# Patient Record
Sex: Female | Born: 1980
Health system: Southern US, Community
[De-identification: ages and names within clinical notes are randomized; demographics above are authoritative.]

## PROBLEM LIST (undated history)

## (undated) DIAGNOSIS — O24419 Gestational diabetes mellitus in pregnancy, unspecified control: Secondary | ICD-10-CM

## (undated) DIAGNOSIS — R102 Pelvic and perineal pain: Secondary | ICD-10-CM

## (undated) DIAGNOSIS — N854 Malposition of uterus: Secondary | ICD-10-CM

## (undated) DIAGNOSIS — N83202 Unspecified ovarian cyst, left side: Secondary | ICD-10-CM

## (undated) DIAGNOSIS — I10 Essential (primary) hypertension: Secondary | ICD-10-CM

## (undated) DIAGNOSIS — F419 Anxiety disorder, unspecified: Secondary | ICD-10-CM

## (undated) DIAGNOSIS — N926 Irregular menstruation, unspecified: Secondary | ICD-10-CM

## (undated) DIAGNOSIS — G40909 Epilepsy, unspecified, not intractable, without status epilepticus: Secondary | ICD-10-CM

## (undated) HISTORY — DX: Malposition of uterus: N85.4

## (undated) HISTORY — DX: Epilepsy, unspecified, not intractable, without status epilepticus: G40.909

## (undated) HISTORY — DX: Gestational diabetes mellitus in pregnancy, unspecified control: O24.419

## (undated) HISTORY — DX: Essential (primary) hypertension: I10

## (undated) HISTORY — DX: Irregular menstruation, unspecified: N92.6

## (undated) HISTORY — PX: LASIK: SHX215

## (undated) HISTORY — PX: WISDOM TOOTH EXTRACTION: SHX21

## (undated) HISTORY — DX: Unspecified ovarian cyst, left side: N83.202

## (undated) HISTORY — DX: Pelvic and perineal pain: R10.2

---
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
## 2001-02-23 ENCOUNTER — Other Ambulatory Visit: Admission: RE | Admit: 2001-02-23 | Discharge: 2001-02-23 | Payer: Self-pay | Admitting: Obstetrics and Gynecology

## 2002-04-13 ENCOUNTER — Ambulatory Visit (HOSPITAL_COMMUNITY): Admission: RE | Admit: 2002-04-13 | Discharge: 2002-04-13 | Payer: Self-pay | Admitting: Obstetrics and Gynecology

## 2002-04-16 ENCOUNTER — Ambulatory Visit (HOSPITAL_COMMUNITY): Admission: AD | Admit: 2002-04-16 | Discharge: 2002-04-16 | Payer: Self-pay | Admitting: Obstetrics and Gynecology

## 2002-07-04 ENCOUNTER — Observation Stay (HOSPITAL_COMMUNITY): Admission: AD | Admit: 2002-07-04 | Discharge: 2002-07-05 | Payer: Self-pay | Admitting: Obstetrics and Gynecology

## 2002-07-18 ENCOUNTER — Inpatient Hospital Stay (HOSPITAL_COMMUNITY): Admission: AD | Admit: 2002-07-18 | Discharge: 2002-07-21 | Payer: Self-pay | Admitting: Obstetrics & Gynecology

## 2003-01-23 ENCOUNTER — Ambulatory Visit (HOSPITAL_COMMUNITY): Admission: RE | Admit: 2003-01-23 | Discharge: 2003-01-23 | Payer: Self-pay | Admitting: Obstetrics and Gynecology

## 2003-01-23 ENCOUNTER — Encounter: Payer: Self-pay | Admitting: Obstetrics and Gynecology

## 2008-03-16 ENCOUNTER — Other Ambulatory Visit: Admission: RE | Admit: 2008-03-16 | Discharge: 2008-03-16 | Payer: Self-pay | Admitting: Obstetrics & Gynecology

## 2009-06-11 ENCOUNTER — Other Ambulatory Visit: Admission: RE | Admit: 2009-06-11 | Discharge: 2009-06-11 | Payer: Self-pay | Admitting: Obstetrics & Gynecology

## 2010-07-16 ENCOUNTER — Other Ambulatory Visit: Admission: RE | Admit: 2010-07-16 | Discharge: 2010-07-16 | Payer: Self-pay | Admitting: Obstetrics & Gynecology

## 2011-01-16 NOTE — H&P (Signed)
   NAME:  Catherine Bishop, Catherine Bishop                          ACCOUNT NO.:  [id number]   MEDICAL RECORD NO.:  [id number]                   PATIENT TYPE:  INP   LOCATION:  A427                                 FACILITY:  [facility]   PHYSICIAN:  Lazaro Arms, M.D.                DATE OF BIRTH:  Oct 26, 1980   DATE OF ADMISSION:  07/18/2002  DATE OF DISCHARGE:                                HISTORY & PHYSICAL   HISTORY OF PRESENT ILLNESS:  The patient is a 30 year old white female,  gravida 1, para 0, estimated date of delivery 08/02/02, admitted for cervical  ripening and induction.  She has had since the last week in October  significant lower extremity swelling.  Blood pressures have been stable, and  two preeclamptic workups have been negative.  Her swelling continues to be  approximately 2-3+.  Fetal status has been reassuring.  Her cervix is 1,  thick, -3 vertex, and she is admitted for Foley bulb ripening.   PAST MEDICAL HISTORY:  Negative.   PAST SURGICAL HISTORY:  Negative.   PAST OBSTETRICAL HISTORY:  She is nulliparous.   ALLERGIES:  PENICILLIN and SEASONAL ALLERGIES.   MEDICATIONS:  __________ therapy and her vitamins.   SOCIAL HISTORY:  She is married and works in Clinical biochemist.   FAMILY HISTORY:  Significant for diabetes, heart attacks, heart disease.   REVIEW OF SYSTEMS:  Otherwise negative.   PRENATAL LABORATORY DATA:  Her blood type is O positive, antibody screen  negative.  Serology is nonreactive.  Pap, GC negative.  Pap is within normal  limits.  Rubella is immune.  HIV is negative.  __________ is negative.  Glucola was 91.  H&H was good as 12 and 38.  Her AFP was normal.   PHYSICAL EXAMINATION:  HEENT:  Unremarkable.  NECK:  Thyroid is normal.  BREASTS:  Deferred.  ABDOMEN:  Fundal height 37 cm.  PELVIC:  Cervix as above.  EXTREMITIES:  2+ edema.   IMPRESSION:  1. Intrauterine pregnancy at 80 weeks' gestation.  2. Significant lower extremity edema with negative  preeclamptic workups.    PLAN:  The patient is admitted for cervical ripening and induction of labor.  She understands the risks and indications and will proceed.                                                Lazaro Arms, M.D.    Loraine Maple  D:  07/19/2002  T:  07/19/2002  Job:  [id number]

## 2011-01-16 NOTE — Op Note (Signed)
   Catherine Bishop, Catherine Bishop                          ACCOUNT NO.:  [id number]   MEDICAL RECORD NO.:  [id number]                   PATIENT TYPE:  INP   LOCATION:  A427                                 FACILITY:  [facility]   PHYSICIAN:  Lazaro Arms, M.D.                DATE OF BIRTH:  04-Jun-1981   DATE OF PROCEDURE:  07/19/2002  DATE OF DISCHARGE:                                 OPERATIVE REPORT   Catherine Bishop progressed to complete at approximately 16:30. She really was not  feeling any pressure so I allowed the epidural to work until about 17:30 and  had her push a few times but she really did not have any feeling. I cut the  epidural off and we started having feeling of the contractions, we began  pushing. She probably pushed a total of about 20 minutes. She delivered over  a midline episiotomy at 18:42 a viable female infant with Apgar of 9&9  weighing 7 pounds and 14 ounces. Interestingly, the infant had a loose  nuchal cord in a loose figure-of-eight pattern around both ankles, it was  very loose. The cord was clamped, cut and put on the maternal abdomen. The  infant was crying immediately. Cord blood was sent, cord gas could not be  obtained. The episiotomy was midline and was repaired without difficulty.  The placenta was delivered spontaneously. The uterus was somewhat boggy  probably from the long labor and I gave her methargen 0.2 IM and will  continue methargen series postpartum as well as the Pitocin. There was  probably 300 cc blood loss for the delivery. The patient will undergo  routine postpartum care and the infant will undergo routine neonatal care.  She is Rh positive and group B strep negative and she was breast feeding.                                               Lazaro Arms, M.D.    Loraine Maple  D:  07/19/2002  T:  07/19/2002  Job:  [id number]

## 2011-01-16 NOTE — Op Note (Signed)
   NAME:  Catherine Bishop, Catherine Bishop                          ACCOUNT NO.:  [id number]   MEDICAL RECORD NO.:  [id number]                   PATIENT TYPE:  INP   LOCATION:  A427                                 FACILITY:  [facility]   PHYSICIAN:  Lazaro Arms, M.D.                DATE OF BIRTH:  11-16-1980   DATE OF PROCEDURE:  07/16/2002  DATE OF DISCHARGE:                                 OPERATIVE REPORT   PROCEDURE:  Epidural.   INDICATIONS FOR PROCEDURE:  Catherine Bishop is a 30 year old, gravida 1, para 0 term  at 5 cm dilated who was getting really no relief from IV pain medicine. She  had been counselled extensively regarding epidural and was called and  starting placing it about noon. The patient was given a 1000 cc LR bolus.  The patient was placed in sitting position, iliac crests were identified.  The spinous processes were identified. The L2-3 and L3-4 interspaces were  identified and marked. The back was prepped and draped, 1% lidocaine was  injected as a skin wheal. A 17 gauge Tuohy needle was used and the epidural  space was found with a single puncture using a loss of resistance technique.  The catheter was fed to a depth of 5 cm into the epidural space. The 10 cc  of 0.125% bupivacaine  plain was given as a test dose with no reaction, 10  cc of 0.125% bupivacaine was given again and again with no problem. She was  then hooked up to continuous pump. She was given a 9 cc bolus and 12 cc  continuous an hour. She had some blood pressure drop about an hour after  insertion that was remedied with ephedrine. The fetal heart rate remained  reassuring. The patient received good relief to T8.                                               Lazaro Arms, M.D.    Loraine Maple  D:  07/19/2002  T:  07/19/2002  Job:  [id number]

## 2011-01-16 NOTE — Discharge Summary (Signed)
Catherine Bishop, Catherine Bishop                          ACCOUNT NO.:  [id number]   MEDICAL RECORD NO.:  [id number]                   PATIENT TYPE:  OBV   LOCATION:  A415                                 FACILITY:  [facility]   PHYSICIAN:  Roylene Reason. Lisette Grinder, M.D.             DATE OF BIRTH:  27-Sep-1980   DATE OF ADMISSION:  04/16/2002  DATE OF DISCHARGE:  04/16/2002                                 DISCHARGE SUMMARY   HISTORY OF PRESENT ILLNESS:  This is a 30 year old gravida 1, para 0 at [redacted]  weeks gestation who comes into the hospital complaining of cramping and  brown vaginal discharge.  The patient states she had low pelvic cramping  suprapubically last p.m.  She contacted me at that time at which time I  advised her to present to labor and delivery.  However, patient states that  she laid down, went to sleep, and awoke this a.m. again with the suprapubic  cramping.  She has had some brown discharge as well as some vaginal mucus  with some blood streaking.  She denies any bright red vaginal bleeding.  She  denies any significant uterine cramping other than suprapubically.  She  denies any back pain.  No urinary tract symptoms.  No leakage of fluid.  No  change in the vaginal discharge.  No external burning, itching, or odor.  The patient's prenatal course was complicated by a visit to labor and  delivery three days previously with similar complaints.  At that time she  was examined and noted to have closed cervix.  Follow-up the following day  in our office ultrasound was performed, presumably just to check for follow-  up on an adnexal cyst which had apparently resolved.  The patient's prenatal  course is otherwise uncomplicated.   PHYSICAL EXAMINATION:  GENERAL:  She is in no acute distress.  VITAL SIGNS:  Temperature 98.8, pulse 94, respiratory rate 18, blood  pressure 126/68.  HEENT:  Negative.  Mucous membranes are moist.  ABDOMEN:  Soft, nontender.  Uterus is soft, nontender.  Fundal height  is  consistent with [redacted] weeks gestation.  Cannot confirm presenting part.  PELVIC:  Sterile speculum examination performed reveals normal external  genitalia.  No lesions or ulcerations identified.  No vaginal bleeding or  leakage of fluid.  Cervix is visualized, noted to be without any lesions.  Cervix is visually closed.  Normal appearing leukorrhea present with no  bleeding or brown discharge identified.  External fetal monitor reveals  fetal heart rate in the 150s with no decelerations noted.  Normal appearing  long-term variability for [redacted] weeks gestation.  Limited OB ultrasound  performed by Dr. Roylene Reason. Lisette Grinder reveals single intrauterine pregnancy,  fetal cardiac activity identified, fetal movement identified, vertex  presentation at this time.  There is some anterior low lying placenta, but  no placenta previa.  Amniotic fluid is subjectively normal.  BPD and femur  length are consistent with 23-[redacted] weeks gestation.  Anatomic survey is not  performed.  Fetus appears to be female.   LABORATORY STUDIES:  1+ wbc's, 1+ protein, only trace blood.   ASSESSMENT:  A 24 week intrauterine pregnancy with very minimal second  trimester spotting primarily with wiping and limited in mucus.  No evidence  of any vaginitis.  No evidence of any significant vaginal bleeding.  Cramping patient has experienced is suprapubic only in nature and is not  uterine.  Thus, patient discharged to home at this time to follow up if  clinically indicated, particularly early if vaginal bleeding would increase  or if the cramping would become more suggestive of preterm labor.    FINAL DIAGNOSES:  1. Second trimester vaginal spotting.  2. Anterior low lying placenta without placenta previa.   PROCEDURE:  Limited obstetric ultrasound.                                               Donald P. Lisette Grinder, M.D.    DPC/MEDQ  D:  04/16/2002  T:  04/17/2002  Job:  [id number]   cc:   [facility]

## 2011-07-21 ENCOUNTER — Other Ambulatory Visit: Payer: Self-pay | Admitting: Obstetrics & Gynecology

## 2011-07-21 ENCOUNTER — Other Ambulatory Visit (HOSPITAL_COMMUNITY)
Admission: RE | Admit: 2011-07-21 | Discharge: 2011-07-21 | Disposition: A | Discharge status: Home / Self Care | Payer: 59 | Source: Ambulatory Visit | Attending: Obstetrics & Gynecology | Admitting: Obstetrics & Gynecology

## 2011-07-21 DIAGNOSIS — Z01419 Encounter for gynecological examination (general) (routine) without abnormal findings: Secondary | ICD-10-CM | POA: Insufficient documentation

## 2011-09-01 NOTE — L&D Delivery Note (Signed)
Delivery Note At 3:51 AM a viable female was delivered via (Presentation: LOA ) w/ compound posterior (left) hand, nuchal cord, and body cord- somersaulted through.  APGAR: pending,  Weight: pending   Placenta status: 3vc, delivered spontaneously, intact, circumvallate appearance.  Cord:  3vc Dr. Marice Potter called to evaluate laceration, 2 whitish-yellow areas noted w/in laceration  Anesthesia:  Epidural, 1% lidocaine for repair Episiotomy: n/a Lacerations: 2nd degree perineal lac repaired by dr. Marice Potter, 2 inclusion cyts Suture Repair: 3.0 vicryl Est. Blood Loss (mL): 350  Mom to postpartum.  Baby to nursery-stable.  Plans to breastfeed, undecided about contraception.  [facility].  Marge Duncans 07/10/2012, 4:25 AM

## 2011-11-12 LAB — OB RESULTS CONSOLE HIV ANTIBODY (ROUTINE TESTING): HIV: NONREACTIVE

## 2011-11-12 LAB — OB RESULTS CONSOLE RPR: RPR: NONREACTIVE

## 2011-11-12 LAB — OB RESULTS CONSOLE ANTIBODY SCREEN: Antibody Screen: NEGATIVE

## 2011-11-12 LAB — OB RESULTS CONSOLE RUBELLA ANTIBODY, IGM: Rubella: IMMUNE

## 2012-05-18 ENCOUNTER — Encounter: Payer: Self-pay | Admitting: *Deleted

## 2012-05-18 ENCOUNTER — Encounter: Payer: 59 | Attending: Obstetrics & Gynecology | Admitting: *Deleted

## 2012-05-18 DIAGNOSIS — O99810 Abnormal glucose complicating pregnancy: Secondary | ICD-10-CM | POA: Insufficient documentation

## 2012-05-18 DIAGNOSIS — Z713 Dietary counseling and surveillance: Secondary | ICD-10-CM | POA: Insufficient documentation

## 2012-05-18 NOTE — Progress Notes (Signed)
  Patient was seen on 05/18/2012 for Gestational Diabetes self-management class at the [facility]. The following learning objectives were met by the patient during this course:   States the definition of Gestational Diabetes  States why dietary management is important in controlling blood glucose  Describes the effects each nutrient has on blood glucose levels  Demonstrates ability to create a balanced meal plan  Demonstrates carbohydrate counting   States when to check blood glucose levels  Demonstrates proper blood glucose monitoring techniques  States the effect of stress and exercise on blood glucose levels  States the importance of limiting caffeine and abstaining from alcohol and smoking  Blood glucose monitor given: Accu Chek Nano BG Monitoring Kit Lot # [id number] Exp: 10/2015 Blood glucose reading: 99 mg/dl  Patient instructed to monitor glucose levels: FBS: 60 - <90 2 hour: <120  *Patient received handouts:  Nutrition Diabetes and Pregnancy  Carbohydrate Counting List  Patient will be seen for follow-up as needed.

## 2012-05-18 NOTE — Patient Instructions (Signed)
Goals:  Check glucose levels per MD as instructed  Follow Gestational Diabetes Diet as instructed  Call for follow-up as needed    

## 2012-06-23 LAB — OB RESULTS CONSOLE GC/CHLAMYDIA: Chlamydia: NEGATIVE

## 2012-06-23 LAB — OB RESULTS CONSOLE GBS: GBS: NEGATIVE

## 2012-06-28 ENCOUNTER — Encounter (HOSPITAL_COMMUNITY): Payer: Self-pay | Admitting: *Deleted

## 2012-06-28 ENCOUNTER — Telehealth (HOSPITAL_COMMUNITY): Payer: Self-pay | Admitting: *Deleted

## 2012-06-28 NOTE — Telephone Encounter (Signed)
Preadmission screen  

## 2012-07-09 ENCOUNTER — Encounter (HOSPITAL_COMMUNITY): Payer: Self-pay

## 2012-07-09 ENCOUNTER — Inpatient Hospital Stay (HOSPITAL_COMMUNITY)
Admission: RE | Admit: 2012-07-09 | Discharge: 2012-07-11 | DRG: 775 | Disposition: A | Discharge status: Home / Self Care | Payer: 59 | Source: Ambulatory Visit | Attending: Obstetrics and Gynecology | Admitting: Obstetrics and Gynecology

## 2012-07-09 DIAGNOSIS — O99814 Abnormal glucose complicating childbirth: Principal | ICD-10-CM | POA: Diagnosis present

## 2012-07-09 DIAGNOSIS — O328XX0 Maternal care for other malpresentation of fetus, not applicable or unspecified: Secondary | ICD-10-CM | POA: Diagnosis present

## 2012-07-09 HISTORY — DX: Anxiety disorder, unspecified: F41.9

## 2012-07-09 LAB — CBC
HCT: 37.5 % (ref 36.0–46.0)
MCHC: 33.6 g/dL (ref 30.0–36.0)
MCV: 85 fL (ref 78.0–100.0)
RDW: 14.9 % (ref 11.5–15.5)

## 2012-07-09 LAB — TYPE AND SCREEN: ABO/RH(D): O POS

## 2012-07-09 LAB — ABO/RH: ABO/RH(D): O POS

## 2012-07-09 MED ORDER — LACTATED RINGERS IV SOLN
INTRAVENOUS | Status: DC
  Administered 2012-07-09 (×2): via INTRAVENOUS

## 2012-07-09 MED ORDER — TERBUTALINE SULFATE 1 MG/ML IJ SOLN: 0.2500 mg | Freq: Once | INTRAMUSCULAR | Status: AC | PRN

## 2012-07-09 MED ORDER — ACETAMINOPHEN 325 MG PO TABS: 650.0000 mg | ORAL_TABLET | ORAL | Status: DC | PRN

## 2012-07-09 MED ORDER — LIDOCAINE HCL (PF) 1 % IJ SOLN
30.0000 mL | INTRAMUSCULAR | Status: DC | PRN
  Administered 2012-07-10: 30 mL via SUBCUTANEOUS
  Filled 2012-07-09: qty 30

## 2012-07-09 MED ORDER — CITRIC ACID-SODIUM CITRATE 334-500 MG/5ML PO SOLN
30.0000 mL | ORAL | Status: DC | PRN
  Administered 2012-07-10: 30 mL via ORAL
  Filled 2012-07-09: qty 15

## 2012-07-09 MED ORDER — MISOPROSTOL 25 MCG QUARTER TABLET
25.0000 ug | ORAL_TABLET | ORAL | Status: DC | PRN
  Administered 2012-07-09 (×2): 25 ug via VAGINAL
  Filled 2012-07-09 (×4): qty 0.25

## 2012-07-09 MED ORDER — ONDANSETRON HCL 4 MG/2ML IJ SOLN: 4.0000 mg | Freq: Four times a day (QID) | INTRAMUSCULAR | Status: DC | PRN

## 2012-07-09 MED ORDER — OXYTOCIN 40 UNITS IN LACTATED RINGERS INFUSION - SIMPLE MED
62.5000 mL/h | INTRAVENOUS | Status: DC
  Filled 2012-07-09: qty 1000

## 2012-07-09 MED ORDER — IBUPROFEN 600 MG PO TABS
600.0000 mg | ORAL_TABLET | Freq: Four times a day (QID) | ORAL | Status: DC | PRN
  Filled 2012-07-09: qty 1

## 2012-07-09 MED ORDER — OXYTOCIN BOLUS FROM INFUSION
500.0000 mL | INTRAVENOUS | Status: DC
  Administered 2012-07-10: 500 mL via INTRAVENOUS

## 2012-07-09 MED ORDER — OXYCODONE-ACETAMINOPHEN 5-325 MG PO TABS: 1.0000 | ORAL_TABLET | ORAL | Status: DC | PRN

## 2012-07-09 MED ORDER — LACTATED RINGERS IV SOLN: 500.0000 mL | INTRAVENOUS | Status: DC | PRN

## 2012-07-09 NOTE — Progress Notes (Signed)
Pt would like to wait until K. Booker, CNM comes on and talks about POC before making a decision about what steps to take next.

## 2012-07-09 NOTE — Progress Notes (Signed)
Patient ID: IDALIA CLASEN, female   DOB: 1980/10/13, 31 y.o.   MRN: [id number] KIRAN LUPOLI is a 31 y.o. G2P1001 at [redacted]w[redacted]d admitted for IOL  Subjective: Comfortable.  Objective: BP 131/74  Pulse 91  Temp 98.2 F (36.8 C) (Oral)  Resp 18  Ht 5\' 6"  (1.676 m)  Wt 112.492 kg (248 lb)  BMI 40.03 kg/m2  LMP 10/14/2011 Isolated BP elevation   Fetal Heart FHR: 125-135 bpm, variability: moderate,  accelerations:  Present,  decelerations:  Present occ mild variable   Contractions: irreg, q 7-8  SVE:   Dilation: Fingertip Effacement (%): 50 Station: -2 Exam by:: k fields, rn Second cytotec placed by RN  Assessment / Plan:  Labor: cx ripening in progress Fetal Wellbeing: Cat 1 Pain Control:  N/A Expected mode of delivery: NSVD  Rondrick Barreira 07/09/2012, 2:51 PM

## 2012-07-09 NOTE — Progress Notes (Signed)
Patient ID: Catherine Bishop, female   DOB: [birth year], 31 y.o.   MRN: [id number]  Up walking  FHR has been reassuring UCs irregular  Will try to insert Foley at next exam.

## 2012-07-09 NOTE — Progress Notes (Signed)
Patient ID: Catherine Bishop, female   DOB: 06-28-1981, 31 y.o.   MRN: [id number]  Patient had SROM at 1936 of clear fluid.  Cytotec being held due to frequent UCs.   Desires to walk off and on  FHR stable and reassuring.  UCs every 3-5 minutes  Will reevaluate in an hour or so.

## 2012-07-09 NOTE — Progress Notes (Signed)
Patient ID: Catherine Bishop, female   DOB: [birth year], 31 y.o.   MRN: [id number]  Up walking  Last tracing showed reassuring FHR with one single variable decel  UCs spaced somewhat to 4 per 10 minutes  RN will check her when she comes back and we will reevaluate next steps

## 2012-07-09 NOTE — Progress Notes (Signed)
Patient ID: Catherine Bishop, female   DOB: March 15, 1981, 31 y.o.   MRN: [id number]  Attempted insertion of foley bulb  Cervix 1+cm outer os, closed internal os 60% -1 Vertex  FHR reactive UCs frequent, every 2-3 minutes  Will defer Cytotec for now and reevaluate later

## 2012-07-09 NOTE — H&P (Signed)
Catherine Bishop is a 31 y.o. female presenting for IOL. Maternal Medical History:  Reason for admission: G2P1001 at 39.4 wks for IOL indicated by A1GDM  Contractions: Onset was more than 2 days ago.   Frequency: irregular.   Perceived severity is mild.    Fetal activity: Perceived fetal activity is normal.   Last perceived fetal movement was within the past hour.    Prenatal complications: FATs biwekly d/t low PAPPA; A1 GDM  Prenatal Complications - Diabetes: gestational. Diabetes is managed by diet.      OB History    Grav Para Term Preterm Abortions TAB SAB Ect Mult Living   2 1 1       1      Past Medical History  Diagnosis Date  . Pelvic pain in female   . Left ovarian cyst   . Irregular bleeding   . Tilted uterus   . GDM (gestational diabetes mellitus)   . Hypertension   . Epilepsy     childhood  . Gestational diabetes     diet controlled  . Anxiety    Past Surgical History  Procedure Date  . Wisdom tooth extraction    Family History: family history includes Cancer in her maternal grandmother; Diabetes in her maternal grandmother; Heart attack in her maternal grandfather; and Heart disease in her maternal grandfather. Social History:  reports that she quit smoking about 4 years ago. Her smoking use included Cigarettes. She smoked .5 packs per day. She has never used smokeless tobacco. She reports that she does not drink alcohol or use illicit drugs.   Prenatal Transfer Tool  Maternal Diabetes: Yes:  Diabetes Type:  Diet controlled Genetic Screening: Abnormal:  Results: Elevated risk of Trisomy 21 Maternal Ultrasounds/Referrals: Normal Fetal Ultrasounds or other Referrals:  None Maternal Substance Abuse:  No Significant Maternal Medications:  None Significant Maternal Lab Results:  Lab values include: Group B Strep negative, Other: low PAPPA Other Comments:  None  Review of Systems  Constitutional: Negative for fever.  Cardiovascular: Positive for leg  swelling.  Gastrointestinal: Negative for abdominal pain.  Genitourinary: Negative for dysuria.  Skin: Negative for rash.  Neurological: Negative for dizziness and headaches.  Psychiatric/Behavioral: Negative for depression.      Blood pressure 134/84, pulse 103, temperature 98 F (36.7 C), temperature source Oral, resp. rate 24, height 5\' 6"  (1.676 m), weight 112.492 kg (248 lb), last menstrual period 10/14/2011. Maternal Exam:  Uterine Assessment: Contraction strength is mild.  Contraction duration is 20 seconds. Contraction frequency is irregular.   Abdomen: Estimated fetal weight is 7 1/2 - 8#.   Fetal presentation: vertex  Introitus: Normal vulva. Normal vagina.  Vagina is negative for discharge.  Ferning test: not done.  Nitrazine test: not done. Amniotic fluid character: not assessed.  Pelvis: adequate for delivery.   Cervix: Cervix evaluated by digital exam.   Posterior, soft, FTP, long, -3 cehpalic  Fetal Exam Fetal Monitor Review: Mode: ultrasound.   Baseline rate: 140.  Variability: moderate (6-25 bpm).   Pattern: accelerations present and no decelerations.    Fetal State Assessment: Category I - tracings are normal.     Physical Exam  Constitutional: She is oriented to person, place, and time. She appears well-developed and well-nourished.  HENT:  Head: Normocephalic.  Eyes: Pupils are equal, round, and reactive to light.  Neck: Normal range of motion.  Cardiovascular: Normal rate and normal heart sounds.   No murmur heard. Respiratory: Effort normal and breath sounds normal.  GI: Soft. There is no tenderness.  Genitourinary: No vaginal discharge found.  Musculoskeletal: Normal range of motion. She exhibits edema.       1-2+ LE edema, no clonus  Neurological: She is alert and oriented to person, place, and time. She has normal reflexes.  Skin: Skin is warm and dry.  Psychiatric: She has a normal mood and affect. Her behavior is normal. Judgment normal.      Prenatal labs: ABO, Rh: O/Positive/-- (03/14 0000) Antibody: Negative (03/14 0000) Rubella: Immune (03/14 0000) RPR: Nonreactive (03/14 0000)  HBsAg: Negative (03/14 0000)  HIV: Non-reactive (03/14 0000)  GBS: Negative (10/24 0000)  2 hr: 83-153-156 Low PAPPA  Assessment/Plan: G2P1001 at 39.4 wks admitted for cx ripening/IOL indicated by A1GDM and low PAPPA Cytotec for cx ripeining   Catherine Bishop 07/09/2012, 9:24 AM

## 2012-07-10 ENCOUNTER — Encounter (HOSPITAL_COMMUNITY): Payer: Self-pay | Admitting: Anesthesiology

## 2012-07-10 ENCOUNTER — Encounter (HOSPITAL_COMMUNITY): Payer: Self-pay

## 2012-07-10 ENCOUNTER — Inpatient Hospital Stay (HOSPITAL_COMMUNITY): Payer: 59 | Admitting: Anesthesiology

## 2012-07-10 LAB — CBC
HCT: 35.4 % — ABNORMAL LOW (ref 36.0–46.0)
HCT: 34.3 % — ABNORMAL LOW (ref 36.0–46.0)
Hemoglobin: 11.6 g/dL — ABNORMAL LOW (ref 12.0–15.0)
Hemoglobin: 11.4 g/dL — ABNORMAL LOW (ref 12.0–15.0)
MCH: 28.4 pg (ref 26.0–34.0)
MCV: 85.5 fL (ref 78.0–100.0)
Platelets: 269 10*3/uL (ref 150–400)
RBC: 4.01 MIL/uL (ref 3.87–5.11)
RDW: 15.2 % (ref 11.5–15.5)
WBC: 13.9 10*3/uL — ABNORMAL HIGH (ref 4.0–10.5)
WBC: 15.2 10*3/uL — ABNORMAL HIGH (ref 4.0–10.5)

## 2012-07-10 MED ORDER — SODIUM CHLORIDE 0.9 % IJ SOLN: 3.0000 mL | INTRAMUSCULAR | Status: DC | PRN

## 2012-07-10 MED ORDER — SIMETHICONE 80 MG PO CHEW: 80.0000 mg | CHEWABLE_TABLET | ORAL | Status: DC | PRN

## 2012-07-10 MED ORDER — MEASLES, MUMPS & RUBELLA VAC ~~LOC~~ INJ
0.5000 mL | INJECTION | Freq: Once | SUBCUTANEOUS | Status: DC
  Filled 2012-07-10: qty 0.5

## 2012-07-10 MED ORDER — BENZOCAINE-MENTHOL 20-0.5 % EX AERO
1.0000 "application " | INHALATION_SPRAY | CUTANEOUS | Status: DC | PRN
  Administered 2012-07-10 – 2012-07-11 (×2): 1 via TOPICAL
  Filled 2012-07-10 (×3): qty 56

## 2012-07-10 MED ORDER — IBUPROFEN 600 MG PO TABS
600.0000 mg | ORAL_TABLET | Freq: Four times a day (QID) | ORAL | Status: DC
  Administered 2012-07-10 – 2012-07-11 (×6): 600 mg via ORAL
  Filled 2012-07-10 (×5): qty 1

## 2012-07-10 MED ORDER — ONDANSETRON HCL 4 MG/2ML IJ SOLN: 4.0000 mg | INTRAMUSCULAR | Status: DC | PRN

## 2012-07-10 MED ORDER — BISACODYL 10 MG RE SUPP
10.0000 mg | Freq: Every day | RECTAL | Status: DC | PRN
  Filled 2012-07-10: qty 1

## 2012-07-10 MED ORDER — DIPHENHYDRAMINE HCL 50 MG/ML IJ SOLN: 12.5000 mg | INTRAMUSCULAR | Status: DC | PRN

## 2012-07-10 MED ORDER — FENTANYL 2.5 MCG/ML BUPIVACAINE 1/10 % EPIDURAL INFUSION (WH - ANES)
14.0000 mL/h | INTRAMUSCULAR | Status: DC
  Administered 2012-07-10: 14 mL/h via EPIDURAL
  Filled 2012-07-10: qty 125

## 2012-07-10 MED ORDER — DIBUCAINE 1 % RE OINT
1.0000 "application " | TOPICAL_OINTMENT | RECTAL | Status: DC | PRN
  Administered 2012-07-10: 1 via RECTAL
  Filled 2012-07-10 (×2): qty 28

## 2012-07-10 MED ORDER — LIDOCAINE HCL (PF) 1 % IJ SOLN
INTRAMUSCULAR | Status: DC | PRN
  Administered 2012-07-10 (×2): 5 mL

## 2012-07-10 MED ORDER — ZOLPIDEM TARTRATE 5 MG PO TABS: 5.0000 mg | ORAL_TABLET | Freq: Every evening | ORAL | Status: DC | PRN

## 2012-07-10 MED ORDER — PHENYLEPHRINE 40 MCG/ML (10ML) SYRINGE FOR IV PUSH (FOR BLOOD PRESSURE SUPPORT): 80.0000 ug | PREFILLED_SYRINGE | INTRAVENOUS | Status: DC | PRN

## 2012-07-10 MED ORDER — WITCH HAZEL-GLYCERIN EX PADS
1.0000 "application " | MEDICATED_PAD | CUTANEOUS | Status: DC | PRN
  Administered 2012-07-10: 1 via TOPICAL

## 2012-07-10 MED ORDER — PRENATAL MULTIVITAMIN CH
1.0000 | ORAL_TABLET | Freq: Every day | ORAL | Status: DC
  Administered 2012-07-10 – 2012-07-11 (×2): 1 via ORAL
  Filled 2012-07-10 (×2): qty 1

## 2012-07-10 MED ORDER — OXYCODONE-ACETAMINOPHEN 5-325 MG PO TABS
1.0000 | ORAL_TABLET | ORAL | Status: DC | PRN
  Filled 2012-07-10: qty 1

## 2012-07-10 MED ORDER — PHENYLEPHRINE 40 MCG/ML (10ML) SYRINGE FOR IV PUSH (FOR BLOOD PRESSURE SUPPORT)
80.0000 ug | PREFILLED_SYRINGE | INTRAVENOUS | Status: DC | PRN
  Filled 2012-07-10: qty 5

## 2012-07-10 MED ORDER — LANOLIN HYDROUS EX OINT: TOPICAL_OINTMENT | CUTANEOUS | Status: DC | PRN

## 2012-07-10 MED ORDER — LACTATED RINGERS IV SOLN
500.0000 mL | Freq: Once | INTRAVENOUS | Status: AC
  Administered 2012-07-10: 500 mL via INTRAVENOUS

## 2012-07-10 MED ORDER — SODIUM CHLORIDE 0.9 % IV SOLN: 250.0000 mL | INTRAVENOUS | Status: DC | PRN

## 2012-07-10 MED ORDER — SENNOSIDES-DOCUSATE SODIUM 8.6-50 MG PO TABS
2.0000 | ORAL_TABLET | Freq: Every day | ORAL | Status: DC
  Administered 2012-07-10: 2 via ORAL

## 2012-07-10 MED ORDER — EPHEDRINE 5 MG/ML INJ: 10.0000 mg | INTRAVENOUS | Status: DC | PRN

## 2012-07-10 MED ORDER — ONDANSETRON HCL 4 MG PO TABS: 4.0000 mg | ORAL_TABLET | ORAL | Status: DC | PRN

## 2012-07-10 MED ORDER — OXYTOCIN 40 UNITS IN LACTATED RINGERS INFUSION - SIMPLE MED: 62.5000 mL/h | INTRAVENOUS | Status: DC | PRN

## 2012-07-10 MED ORDER — SODIUM CHLORIDE 0.9 % IJ SOLN: 3.0000 mL | Freq: Two times a day (BID) | INTRAMUSCULAR | Status: DC

## 2012-07-10 MED ORDER — TETANUS-DIPHTH-ACELL PERTUSSIS 5-2.5-18.5 LF-MCG/0.5 IM SUSP
0.5000 mL | Freq: Once | INTRAMUSCULAR | Status: AC
  Administered 2012-07-11: 0.5 mL via INTRAMUSCULAR
  Filled 2012-07-10: qty 0.5

## 2012-07-10 MED ORDER — FLEET ENEMA 7-19 GM/118ML RE ENEM: 1.0000 | ENEMA | Freq: Every day | RECTAL | Status: DC | PRN

## 2012-07-10 MED ORDER — DIPHENHYDRAMINE HCL 25 MG PO CAPS: 25.0000 mg | ORAL_CAPSULE | Freq: Four times a day (QID) | ORAL | Status: DC | PRN

## 2012-07-10 NOTE — Anesthesia Procedure Notes (Signed)
Epidural Patient location during procedure: OB Start time: 07/10/2012 2:02 AM  Staffing Anesthesiologist: Brayton Caves R Performed by: anesthesiologist   Preanesthetic Checklist Completed: patient identified, site marked, surgical consent, pre-op evaluation, timeout performed, IV checked, risks and benefits discussed and monitors and equipment checked  Epidural Patient position: sitting Prep: site prepped and draped and DuraPrep Patient monitoring: continuous pulse ox and blood pressure Approach: midline Injection technique: LOR air and LOR saline  Needle:  Needle type: Tuohy  Needle gauge: 17 G Needle length: 9 cm and 9 Needle insertion depth: 8 cm Catheter type: closed end flexible Catheter size: 19 Gauge Catheter at skin depth: 14 cm Test dose: negative  Assessment Events: blood not aspirated, injection not painful, no injection resistance, negative IV test and no paresthesia  Additional Notes Patient identified.  Risk benefits discussed including failed block, incomplete pain control, headache, nerve damage, paralysis, blood pressure changes, nausea, vomiting, reactions to medication both toxic or allergic, and postpartum back pain.  Patient expressed understanding and wished to proceed.  All questions were answered.  Sterile technique used throughout procedure and epidural site dressed with sterile barrier dressing. No paresthesia or other complications noted.The patient did not experience any signs of intravascular injection such as tinnitus or metallic taste in mouth nor signs of intrathecal spread such as rapid motor block. Please see nursing notes for vital signs.

## 2012-07-10 NOTE — Progress Notes (Signed)
Catherine Bishop is a 31 y.o. G2P1001 at [redacted]w[redacted]d admitted for induction of labor due to A1DM and low PAPP-A.  Subjective: Mostly comfortable w/ epidural, having sharp pain and pressure in vaginal area w/ uc's. Family supportive at bs.   Objective: BP 118/78  Pulse 88  Temp 98.5 F (36.9 C) (Oral)  Resp 20  Ht 5\' 6"  (1.676 m)  Wt 112.492 kg (248 lb)  BMI 40.03 kg/m2  SpO2 100%  LMP 10/14/2011      FHT:  FHR: 150 bpm, variability: moderate,  accelerations:  Abscent,  decelerations:  Present earlies and variables UC:   irregular, every 1-4 minutes SVE:   6.5/90/-2, posterior  Labs: Lab Results  Component Value Date   WBC 13.9* 07/10/2012   HGB 11.6* 07/10/2012   HCT 35.4* 07/10/2012   MCV 86.1 07/10/2012   PLT 295 07/10/2012    Assessment / Plan: IOL d/t A1DM and low PAPP-A, progressing normally, RN to position pt in exaggerated sims and hit epidural pcea button  Labor: Progressing normally Preeclampsia:  n/a Fetal Wellbeing:  Category II Pain Control:  Epidural I/D:  n/a Anticipated MOD:  NSVD  Catherine Bishop 07/10/2012, 2:51 AM

## 2012-07-10 NOTE — Progress Notes (Signed)
Catherine Bishop is a 31 y.o. G2P1001 at [redacted]w[redacted]d admitted for induction of labor due to Gestational diabetes and low PAPP-A.  Subjective: Very uncomfortable w/ uc's, anxious, breathing well.  FOB and family supportive at bs. Requests epidural  Objective: BP 136/103  Pulse 87  Temp 98 F (36.7 C) (Oral)  Resp 20  Ht 5\' 6"  (1.676 m)  Wt 112.492 kg (248 lb)  BMI 40.03 kg/m2  LMP 10/14/2011      FHT:  FHR: 130 bpm, variability: moderate,  accelerations:  Present,  decelerations:  Absent UC:   regular, every 2-3 minutes SVE:   Dilation: 6 Effacement (%): 70 Station: -2 Exam by:: K Martin Smeal CNM posterior  Labs: Lab Results  Component Value Date   WBC 12.2* 07/09/2012   HGB 12.6 07/09/2012   HCT 37.5 07/09/2012   MCV 85.0 07/09/2012   PLT 316 07/09/2012    Assessment / Plan: IOL d/t A1DM and Low PAPP-A, s/p cervical ripening stage- last cytotec @ 1400, s/p srom clear fluid at 1936, now in active labor  Labor: Progressing normally Preeclampsia:  n/a Fetal Wellbeing:  Category I Pain Control:  Labor support without medications and requesting epidural I/D:  n/a Anticipated MOD:  NSVD  Marge Duncans 07/10/2012, 12:52 AM

## 2012-07-10 NOTE — Anesthesia Postprocedure Evaluation (Signed)
  Anesthesia Post-op Note  Patient: Catherine Bishop  Procedure(s) Performed: * No procedures listed *  Patient Location: Mother/Baby  Anesthesia Type:Epidural  Level of Consciousness: awake, alert  and oriented  Airway and Oxygen Therapy: Patient Spontanous Breathing  Post-op Pain: mild  Post-op Assessment: Patient's Cardiovascular Status Stable, Respiratory Function Stable, Patent Airway, No signs of Nausea or vomiting and Pain level controlled  Post-op Vital Signs: stable  Complications: No apparent anesthesia complications

## 2012-07-10 NOTE — Anesthesia Preprocedure Evaluation (Signed)
Anesthesia Evaluation  Patient identified by MRN, date of birth, ID band Patient awake    Reviewed: Allergy & Precautions, H&P , Patient's Chart, lab work & pertinent test results  Airway Mallampati: III TM Distance: >3 FB Neck ROM: full    Dental No notable dental hx.    Pulmonary neg pulmonary ROS,  breath sounds clear to auscultation  Pulmonary exam normal       Cardiovascular hypertension, negative cardio ROS  Rhythm:regular Rate:Normal     Neuro/Psych Seizures -,  PSYCHIATRIC DISORDERS Anxiety negative neurological ROS  negative psych ROS   GI/Hepatic negative GI ROS, Neg liver ROS,   Endo/Other  negative endocrine ROSdiabetesMorbid obesity  Renal/GU negative Renal ROS     Musculoskeletal   Abdominal   Peds  Hematology negative hematology ROS (+)   Anesthesia Other Findings  Pelvic pain in female     Left ovarian cyst        Irregular bleeding     Tilted uterus        GDM (gestational diabetes mellitus)     Hypertension        Epilepsy   childhood Gestational diabetes   diet controlled    Anxiety    Reproductive/Obstetrics (+) Pregnancy                           Anesthesia Physical Anesthesia Plan  ASA: III  Anesthesia Plan: Epidural   Post-op Pain Management:    Induction:   Airway Management Planned:   Additional Equipment:   Intra-op Plan:   Post-operative Plan:   Informed Consent: I have reviewed the patients History and Physical, chart, labs and discussed the procedure including the risks, benefits and alternatives for the proposed anesthesia with the patient or authorized representative who has indicated his/her understanding and acceptance.     Plan Discussed with:   Anesthesia Plan Comments:         Anesthesia Quick Evaluation

## 2012-07-11 MED ORDER — IBUPROFEN 600 MG PO TABS: 600.0000 mg | ORAL_TABLET | Freq: Four times a day (QID) | ORAL | Status: DC

## 2012-07-11 NOTE — Discharge Summary (Signed)
Attestation of Attending Supervision of Advanced Practitioner (CNM/NP): Evaluation and management procedures were performed by the Advanced Practitioner under my supervision and collaboration.  I have reviewed the Advanced Practitioner's note and chart, and I agree with the management and plan.  HARRAWAY-SMITH, Kollyns Mickelson 8:01 AM     

## 2012-07-11 NOTE — Discharge Summary (Signed)
Obstetric Discharge Summary Reason for Admission: induction of labor Prenatal Procedures: none Intrapartum Procedures: spontaneous vaginal delivery Postpartum Procedures: none Complications-Operative and Postpartum: 2 degree perineal laceration Hemoglobin  Date Value Range Status  07/10/2012 11.4* 12.0 - 15.0 g/dL Final     HCT  Date Value Range Status  07/10/2012 34.3* 36.0 - 46.0 % Final    Physical Exam:  Filed Vitals:   07/11/12 0527  BP: 114/77  Pulse: 69  Temp: 97.6 F (36.4 C)  Resp: 16    General: alert, cooperative and no distress Lochia: appropriate Uterine Fundus: firm DVT Evaluation: No evidence of DVT seen on physical exam.  Patient is up ad lib.  Tolerating PO well.  Pain is well controlled.  Discharge Diagnoses: Term Pregnancy-delivered  Discharge Information: Date: 07/11/2012 Activity: pelvic rest Diet: routine Medications: Ibuprofen Condition: stable Instructions: refer to practice specific booklet Discharge to: home  Patient is currently undecided about birth control.  Newborn Data: Live born female  Birth Weight: 8 lb 2.5 oz (3700 g) APGAR: 8, 9  Home with mother.  Nira Conn 07/11/2012, 7:22 AM  I examined pt and agree with documentation above and PA-S plan of care. [facility]

## 2012-07-15 ENCOUNTER — Ambulatory Visit (HOSPITAL_COMMUNITY)
Admit: 2012-07-15 | Discharge: 2012-07-15 | Payer: 59 | Attending: Obstetrics & Gynecology | Admitting: Obstetrics & Gynecology

## 2012-07-21 ENCOUNTER — Ambulatory Visit (HOSPITAL_COMMUNITY): Admission: RE | Admit: 2012-07-21 | Payer: 59 | Source: Ambulatory Visit

## 2012-07-21 ENCOUNTER — Ambulatory Visit (HOSPITAL_COMMUNITY): Payer: 59

## 2012-07-26 ENCOUNTER — Ambulatory Visit (HOSPITAL_COMMUNITY): Payer: 59

## 2012-08-01 ENCOUNTER — Ambulatory Visit (HOSPITAL_COMMUNITY)
Admission: RE | Admit: 2012-08-01 | Discharge: 2012-08-01 | Disposition: A | Discharge status: Home / Self Care | Payer: 59 | Source: Ambulatory Visit | Attending: Obstetrics and Gynecology | Admitting: Obstetrics and Gynecology

## 2012-08-01 NOTE — Progress Notes (Signed)
Adult Lactation Consultation Outpatient Visit Note  Patient Name: Catherine Bishop                Jude                                                                     todays weight, 1-6.1,0960 Date of Birth: June 10, 1981 Gestational Age at Delivery: Unknown Type of Delivery: vaginal del  Breastfeeding History: Frequency of Breastfeeding:  Length of Feeding:  Voids:10-12  Stools: 10-12 yellow watery  Supplementing / Method: Pumping:  Type of Pump: Medela manual    Frequency: every 3 hours   Volume:  2.5 -3 ounces  Comments: Mother has [facility] loaner pump and returned it last week. Mother has been using Medela double manuel pump for 20 mins every 3 hours. She states she is unable to secure electric pump from [facility] and that her insurance company has not given her a  Pump yet. She plans to follow up again with ins. Company today.    Consultation Evaluation:  Observed Catherine Bishop tongue to be tight posteriorly. Mother states that her other child had breastfeeding difficulty and she was unable to breastfeed. She states that the other child had profound speech difficultly and needed speech therapy.  Observed mother latching infant onto bare breast. Observed nipple pinched. Mother was fit with #24 nipple shield.  Mother describes pain scale of 3-4 the entire feeding. Infant was placed in football and cross cradle hold. Infants jaw was adjusted for wider gape. Mother continued to describe painful latch.   Initial Feeding Assessment: Pre-feed AVWUJW:1191 Post-feed YNWGNF:6213 Amount Transferred:53ml Comments:  Additional Feeding Assessment: Pre-feed Weight:3712 Post-feed YQMVHQ:4696 Amount Transferred:48ml Comments:  Additional Feeding Assessment: Pre-feed Weight: Post-feed Weight: Amount Transferred: Comments:  Total Breast milk Transferred this Visit:  Total Supplement Given:   Additional Interventions: Mother informed of need to evaluate infants tongue . Dr Frederik Pear name given and  encouraged mother to discuss with husband.  Mother encouraged to continue to offer breast using #24 nipple shield and to supplement with bottle giving at least 3-3.5 ounces of EBM/ formula. inst mother to continue to pump every 3 hours and to offer breast using nipple shield as tolerated. Follow up in one week.  Follow-Up  December 9 at 10:30    Stevan Born [facility] 08/01/2012, 3:52 PM

## 2012-08-08 ENCOUNTER — Ambulatory Visit (HOSPITAL_COMMUNITY)
Admission: RE | Admit: 2012-08-08 | Discharge: 2012-08-08 | Disposition: A | Discharge status: Home / Self Care | Payer: 59 | Source: Ambulatory Visit | Attending: Obstetrics & Gynecology | Admitting: Obstetrics & Gynecology

## 2012-08-08 NOTE — Progress Notes (Signed)
Adult Lactation Consultation Outpatient Visit Note  Patient Name: Catherine Bishop Date of Birth: 1980-12-01                                                       Baby Boy Jude Culkin, DOB [birth year], now 11 weeks old                                                                                             Birth Weight 8 lb. 2.2 oz  Gestational Age at Delivery: Unknown Type of Delivery: SVB  39 weeks gest.  Breastfeeding History: Frequency of Breastfeeding: every 3 1/2 - 4 hours Length of Feeding: 30-60 minutes Voids: 8 per day Stools: 8 per day/brown-yellow, loose  Supplementing / Method: Pumping:  Type of Pump:  Medela Pump N Style   Frequency:  Was pumping every 3 hours, but now she pumps when she is away from home.  Volume:  3 oz combined from both breasts  Comments: Mom is here for feeding assessment and weight check. Baby Jude had his labial frenulum clipped on Thursday, 08/04/12. Mom is using the #24 nipple shield to latch baby Jude. She reports he will not latch without the nipple shield. Mom reports no pain with breastfeeding since having the labial frenulum clipped.  She was supplementing Baby Jude with 3 -3.5 oz of EBM till about 2 days ago. Stopped supplements on Saturday. Mom is currently on Keflex for clogged milk duct in right breast at 2:00 area of breast. Mom started Keflex on Friday, 08/05/12. Mom is taking Fenugreek 1 tab TID, 610 mg to increase milk production. Baby Jude's weight at last outpatient appointment on 08/01/12 was 8 lb. 2.2 oz, he was back to his birth weight.    Consultation Evaluation: On exam today, Mom's right breast at 2:00 has no redness, no nodules palpable. Mom reports the breast is feeling better. Mom latched Baby Jude on right breast using a #20 nipple shield without any difficulty. Mom has copious amounts of breast milk, leaking from around baby's mouth and down her breast. Baby Jude breast fed for 10 minutes, then we removed the nipple shield. After few  attempts and breast compression, Baby Jude was able to develop a rhythmic suck, good swallows audible and Mom reports no pain. He breast fed on the right breast for another 8-10 minutes. Mom has been breast feeding using a cross cradle hold and baby latches easily with nipple shield in this position, but to latch without the nipple shield Baby Jude needed to be in a foot ball hold. We started Baby Jude in the football hold on the left breast with the nipple shield. He breast fed for 3 minutes, again copious amounts of breast milk present, then re-latched Baby Jude without the nipple shield and he breast fed for another 9 minutes with good rhythmic suck and lots of swallows audible. Mom reported no discomfort with breastfeeding.  Baby Jude has gained approximately  10 oz in the past week.   Initial Feeding Assessment: Pre-feed Weight:  8 lb. 10.2 oz/3918 gm Post-feed Weight:  8 lb. 11.4 oz/3952 gm Amount Transferred:  34 ml Comments: from right breast  Additional Feeding Assessment: Pre-feed Weight:  8 lb. 11.4 oz/3952 gm Post-feed Weight:  8 lb. 13.6 oz/4014 gm Amount Transferred:  62 ml. Comments:  From left breast  Additional Feeding Assessment: Pre-feed Weight: Post-feed Weight: Amount Transferred: Comments:  Total Breast milk Transferred this Visit: 96 ml Total Supplement Given: None  Additional Interventions: Mom was very pleased with the amount of breast milk baby Jude was able to transfer and pleased that he could latch without the nipple shield. Advised Mom to start weaning Baby Jude from the nipple shield as we did today. Start him with the nipple shield if he will not latch without it, then after 3-5 minutes, take the nipple shield off and re-latch Baby Jude using breast compression as we did today. Advised to complete antibiotics as prescribed, massage to be sure breasts empty. Consider starting acidophyllis to prevent thrush from antibiotic use. Mom's milk supply has responded well  to pumping and Fenugreek. Advised to keep baby at the breast and reduce her pumping to 4-6 time/day or less. Mom needs to post pump to store milk for returning to work. She will continue Fenugreek 1 tab TID.  Follow-Up  Prn Support Group on Tuesday's    Alfred Levins 08/08/2012, 11:12 AM

## 2013-06-10 ENCOUNTER — Emergency Department (HOSPITAL_COMMUNITY): Payer: 59

## 2013-06-10 ENCOUNTER — Encounter (HOSPITAL_COMMUNITY): Payer: Self-pay | Admitting: Emergency Medicine

## 2013-06-10 ENCOUNTER — Emergency Department (HOSPITAL_COMMUNITY)
Admission: EM | Admit: 2013-06-10 | Discharge: 2013-06-10 | Disposition: A | Discharge status: Home / Self Care | Payer: 59 | Attending: Emergency Medicine | Admitting: Emergency Medicine

## 2013-06-10 DIAGNOSIS — R197 Diarrhea, unspecified: Secondary | ICD-10-CM | POA: Insufficient documentation

## 2013-06-10 DIAGNOSIS — Z91040 Latex allergy status: Secondary | ICD-10-CM | POA: Insufficient documentation

## 2013-06-10 DIAGNOSIS — Z8742 Personal history of other diseases of the female genital tract: Secondary | ICD-10-CM | POA: Insufficient documentation

## 2013-06-10 DIAGNOSIS — Z87891 Personal history of nicotine dependence: Secondary | ICD-10-CM | POA: Insufficient documentation

## 2013-06-10 DIAGNOSIS — Z8659 Personal history of other mental and behavioral disorders: Secondary | ICD-10-CM | POA: Insufficient documentation

## 2013-06-10 DIAGNOSIS — Z3202 Encounter for pregnancy test, result negative: Secondary | ICD-10-CM | POA: Insufficient documentation

## 2013-06-10 DIAGNOSIS — R1013 Epigastric pain: Secondary | ICD-10-CM | POA: Insufficient documentation

## 2013-06-10 DIAGNOSIS — R109 Unspecified abdominal pain: Secondary | ICD-10-CM

## 2013-06-10 DIAGNOSIS — Z79899 Other long term (current) drug therapy: Secondary | ICD-10-CM | POA: Insufficient documentation

## 2013-06-10 DIAGNOSIS — Z880 Allergy status to penicillin: Secondary | ICD-10-CM | POA: Insufficient documentation

## 2013-06-10 DIAGNOSIS — I10 Essential (primary) hypertension: Secondary | ICD-10-CM | POA: Insufficient documentation

## 2013-06-10 DIAGNOSIS — R112 Nausea with vomiting, unspecified: Secondary | ICD-10-CM | POA: Insufficient documentation

## 2013-06-10 DIAGNOSIS — R509 Fever, unspecified: Secondary | ICD-10-CM | POA: Insufficient documentation

## 2013-06-10 DIAGNOSIS — Z8669 Personal history of other diseases of the nervous system and sense organs: Secondary | ICD-10-CM | POA: Insufficient documentation

## 2013-06-10 DIAGNOSIS — K8020 Calculus of gallbladder without cholecystitis without obstruction: Secondary | ICD-10-CM | POA: Insufficient documentation

## 2013-06-10 LAB — CBC WITH DIFFERENTIAL/PLATELET
Basophils Absolute: 0 10*3/uL (ref 0.0–0.1)
Basophils Relative: 0 % (ref 0–1)
Eosinophils Absolute: 0 10*3/uL (ref 0.0–0.7)
Eosinophils Relative: 0 % (ref 0–5)
HCT: 43.1 % (ref 36.0–46.0)
Hemoglobin: 14.3 g/dL (ref 12.0–15.0)
MCH: 27.7 pg (ref 26.0–34.0)
MCHC: 33.2 g/dL (ref 30.0–36.0)
MCV: 83.5 fL (ref 78.0–100.0)
Monocytes Absolute: 0.3 10*3/uL (ref 0.1–1.0)
Monocytes Relative: 3 % (ref 3–12)
RDW: 13.7 % (ref 11.5–15.5)

## 2013-06-10 LAB — BASIC METABOLIC PANEL
BUN: 7 mg/dL (ref 6–23)
Calcium: 10 mg/dL (ref 8.4–10.5)
Chloride: 98 mEq/L (ref 96–112)
Creatinine, Ser: 0.7 mg/dL (ref 0.50–1.10)
GFR calc Af Amer: >90 mL/min (ref >90)
GFR calc non Af Amer: >90 mL/min (ref >90)

## 2013-06-10 LAB — COMPREHENSIVE METABOLIC PANEL
Alkaline Phosphatase: 119 U/L — ABNORMAL HIGH (ref 39–117)
BUN: 7 mg/dL (ref 6–23)
Creatinine, Ser: 0.7 mg/dL (ref 0.50–1.10)
GFR calc Af Amer: >90 mL/min (ref >90)
Glucose, Bld: 122 mg/dL — ABNORMAL HIGH (ref 70–99)
Potassium: 4.2 mEq/L (ref 3.5–5.1)
Total Bilirubin: 1.3 mg/dL — ABNORMAL HIGH (ref 0.3–1.2)
Total Protein: 7.8 g/dL (ref 6.0–8.3)

## 2013-06-10 LAB — LIPASE, BLOOD: Lipase: 24 U/L (ref 11–59)

## 2013-06-10 LAB — URINALYSIS, ROUTINE W REFLEX MICROSCOPIC
Ketones, ur: 40 mg/dL — AB
Nitrite: NEGATIVE
Protein, ur: 30 mg/dL — AB
pH: 6 (ref 5.0–8.0)

## 2013-06-10 LAB — URINE MICROSCOPIC-ADD ON

## 2013-06-10 MED ORDER — SODIUM CHLORIDE 0.9 % IV SOLN
1000.0000 mL | INTRAVENOUS | Status: DC
  Administered 2013-06-10 (×2): 1000 mL via INTRAVENOUS

## 2013-06-10 MED ORDER — SODIUM CHLORIDE 0.9 % IV SOLN
1000.0000 mL | Freq: Once | INTRAVENOUS | Status: AC
  Administered 2013-06-10: 1000 mL via INTRAVENOUS

## 2013-06-10 MED ORDER — ONDANSETRON 8 MG PO TBDP: 8.0000 mg | ORAL_TABLET | Freq: Three times a day (TID) | ORAL | Status: DC | PRN

## 2013-06-10 MED ORDER — IOHEXOL 300 MG/ML  SOLN
50.0000 mL | Freq: Once | INTRAMUSCULAR | Status: AC | PRN
  Administered 2013-06-10: 50 mL via ORAL

## 2013-06-10 MED ORDER — OXYCODONE-ACETAMINOPHEN 5-325 MG PO TABS: ORAL_TABLET | ORAL | Status: DC

## 2013-06-10 MED ORDER — ONDANSETRON HCL 4 MG/2ML IJ SOLN
4.0000 mg | Freq: Once | INTRAMUSCULAR | Status: AC
  Administered 2013-06-10: 4 mg via INTRAMUSCULAR
  Filled 2013-06-10: qty 2

## 2013-06-10 MED ORDER — IOHEXOL 300 MG/ML  SOLN
100.0000 mL | Freq: Once | INTRAMUSCULAR | Status: AC | PRN
  Administered 2013-06-10: 100 mL via INTRAVENOUS

## 2013-06-10 MED ORDER — FENTANYL CITRATE 0.05 MG/ML IJ SOLN
50.0000 ug | Freq: Once | INTRAMUSCULAR | Status: AC
  Administered 2013-06-10: 50 ug via INTRAVENOUS
  Filled 2013-06-10: qty 2

## 2013-06-10 MED ORDER — MORPHINE SULFATE 4 MG/ML IJ SOLN
4.0000 mg | Freq: Once | INTRAMUSCULAR | Status: AC
  Administered 2013-06-10: 4 mg via INTRAVENOUS
  Filled 2013-06-10: qty 1

## 2013-06-10 MED ORDER — HYDROMORPHONE HCL PF 1 MG/ML IJ SOLN
1.0000 mg | Freq: Once | INTRAMUSCULAR | Status: AC
  Administered 2013-06-10: 1 mg via INTRAVENOUS
  Filled 2013-06-10: qty 1

## 2013-06-10 MED ORDER — METOCLOPRAMIDE HCL 5 MG/ML IJ SOLN
10.0000 mg | Freq: Once | INTRAMUSCULAR | Status: AC
  Administered 2013-06-10: 10 mg via INTRAVENOUS
  Filled 2013-06-10: qty 2

## 2013-06-10 MED ORDER — DIPHENHYDRAMINE HCL 50 MG/ML IJ SOLN
25.0000 mg | Freq: Once | INTRAMUSCULAR | Status: AC
  Administered 2013-06-10: 25 mg via INTRAVENOUS
  Filled 2013-06-10: qty 1

## 2013-06-10 NOTE — ED Provider Notes (Signed)
CSN: [id number]     Arrival date & time 06/10/13  1406 History  This chart was scribed for Ward Givens, MD by Leone Payor, ED Scribe. This patient was seen in room APA18/APA18 and the patient's care was started 3:09 PM.    Chief Complaint  Patient presents with  . Abdominal Pain  . Emesis  . Diarrhea    The history is provided by the patient. No language interpreter was used.    HPI Comments: Catherine Bishop is a 32 y.o. female who presents to the Emergency Department complaining of constant, gradually worsened epigastric abdominal pain that began last night but worsened overnight. She describes this pain as pressure and states it feels as though having the breath knocked out of her. She denies radiation to the chest or back. She has had associated 4 episodes of emesis and 7-8 episodes of loose stools. She also states having a subjective fever but she states the thermometer does not show one. She reports having a half of a grilled cheese sandwich last night and fruity pebbles for breakfast. She reports having similar symptoms prior to her menstrual cycle each month but this is worse. She states the abdominal pain is not associated with eating spicy foods. Pt's mother has had cholecystectomy/   PCP Dr Thayer Headings  Past Medical History  Diagnosis Date  . Pelvic pain in female   . Left ovarian cyst   . Irregular bleeding   . Tilted uterus   . GDM (gestational diabetes mellitus)   . Hypertension   . Epilepsy     childhood  . Gestational diabetes     diet controlled  . Anxiety    Past Surgical History  Procedure Laterality Date  . Wisdom tooth extraction     Family History  Problem Relation Age of Onset  . Cancer Maternal Grandmother     renal  . Diabetes Maternal Grandmother   . Heart disease Maternal Grandfather   . Heart attack Maternal Grandfather    History  Substance Use Topics  . Smoking status: Former Smoker -- 0.50 packs/day    Types: Cigarettes    Quit date:  06/28/2008  . Smokeless tobacco: Never Used  . Alcohol Use: No     Comment: once per month, not while pregnant  unemployed   OB History   Grav Para Term Preterm Abortions TAB SAB Ect Mult Living   2 2 2       2      Review of Systems  Constitutional: Negative for fever.  Gastrointestinal: Positive for vomiting, abdominal pain and diarrhea.  All other systems reviewed and are negative.    Allergies  Penicillins and Latex  Home Medications   Current Outpatient Rx  Name  Route  Sig  Dispense  Refill  . calcium carbonate (TUMS - DOSED IN MG ELEMENTAL CALCIUM) 500 MG chewable tablet   Oral   Chew 1 tablet by mouth daily as needed. heartburn         . ibuprofen (ADVIL,MOTRIN) 600 MG tablet   Oral   Take 1 tablet (600 mg total) by mouth every 6 (six) hours.   30 tablet   0   . Prenatal Vit-Fe Fumarate-FA (PRENATAL MULTIVITAMIN) TABS   Oral   Take 1 tablet by mouth every morning.         . ranitidine (ZANTAC) 150 MG capsule   Oral   Take 150 mg by mouth 2 (two) times daily.  BP 147/88  Pulse 73  Temp(Src) 97.7 F (36.5 C) (Oral)  Resp 16  Ht 5\' 5"  (1.651 m)  Wt 230 lb (104.327 kg)  BMI 38.27 kg/m2  SpO2 98%  LMP 05/27/2013  Vital signs normal   Physical Exam  Nursing note and vitals reviewed. Constitutional: She is oriented to person, place, and time. She appears well-developed and well-nourished.  Non-toxic appearance. She does not appear ill. She appears distressed.  HENT:  Head: Normocephalic and atraumatic.  Right Ear: External ear normal.  Left Ear: External ear normal.  Nose: Nose normal. No mucosal edema or rhinorrhea.  Mouth/Throat: Oropharynx is clear and moist and mucous membranes are normal. No dental abscesses or uvula swelling.  Eyes: Conjunctivae and EOM are normal. Pupils are equal, round, and reactive to light.  Neck: Normal range of motion and full passive range of motion without pain. Neck supple.  Cardiovascular: Normal  rate, regular rhythm and normal heart sounds.  Exam reveals no gallop and no friction rub.   No murmur heard. Pulmonary/Chest: Effort normal and breath sounds normal. No respiratory distress. She has no wheezes. She has no rhonchi. She has no rales. She exhibits no tenderness and no crepitus.  Abdominal: Soft. Normal appearance and bowel sounds are normal. She exhibits no distension. There is tenderness (epigastric tenderness). There is no rebound and no guarding.    Musculoskeletal: Normal range of motion. She exhibits no edema and no tenderness.  Moves all extremities well.   Neurological: She is alert and oriented to person, place, and time. She has normal strength. No cranial nerve deficit.  Skin: Skin is warm, dry and intact. No rash noted. No erythema. No pallor.  Psychiatric: She has a normal mood and affect. Her speech is normal and behavior is normal. Her mood appears not anxious.    ED Course  Procedures   DIAGNOSTIC STUDIES: Oxygen Saturation is 98% on RA, normal by my interpretation.    COORDINATION OF CARE: 3:53 PM Will order CMP, Lipase, CBC, BMP, UA. Discussed treatment plan with pt at bedside and pt agreed to plan.   Medications  0.9 %  sodium chloride infusion (0 mLs Intravenous Stopped 06/10/13 1638)    Followed by  0.9 %  sodium chloride infusion (1,000 mLs Intravenous New Bag/Given 06/10/13 1755)  ondansetron (ZOFRAN) injection 4 mg (4 mg Intramuscular Given 06/10/13 1544)  metoCLOPramide (REGLAN) injection 10 mg (10 mg Intravenous Given 06/10/13 1614)  diphenhydrAMINE (BENADRYL) injection 25 mg (25 mg Intravenous Given 06/10/13 1614)  fentaNYL (SUBLIMAZE) injection 50 mcg (50 mcg Intravenous Given 06/10/13 1615)  morphine 4 MG/ML injection 4 mg (4 mg Intravenous Given 06/10/13 1754)  HYDROmorphone (DILAUDID) injection 1 mg (1 mg Intravenous Given 06/10/13 1903)  iohexol (OMNIPAQUE) 300 MG/ML solution 50 mL (50 mLs Oral Contrast Given 06/10/13 2022)  iohexol  (OMNIPAQUE) 300 MG/ML solution 100 mL (100 mLs Intravenous Contrast Given 06/10/13 2021)     Pt finally got pain relief after the dilaudid. She had temporary relief with the fentanyl and no relief with the morphine.   CT scan was done since [place] not available.   Bedside US done. GB easily seen, ? Stone near the neck. No free fluid seen in Morrison's pouch. Views archieved.   Have discussed she will need official outpatient [place] and surgery referral.   Labs Review Results for orders placed during the hospital encounter of 06/10/13  CBC WITH DIFFERENTIAL      Result Value Range   WBC 11.8 (*) 4.0 -  10.5 K/uL   RBC 5.16 (*) 3.87 - 5.11 MIL/uL   Hemoglobin 14.3  12.0 - 15.0 g/dL   HCT 10.2  72.5 - 36.6 %   MCV 83.5  78.0 - 100.0 fL   MCH 27.7  26.0 - 34.0 pg   MCHC 33.2  30.0 - 36.0 g/dL   RDW 44.0  34.7 - 42.5 %   Platelets 313  150 - 400 K/uL   Neutrophils Relative % 91 (*) 43 - 77 %   Neutro Abs 10.8 (*) 1.7 - 7.7 K/uL   Lymphocytes Relative 6 (*) 12 - 46 %   Lymphs Abs 0.7  0.7 - 4.0 K/uL   Monocytes Relative 3  3 - 12 %   Monocytes Absolute 0.3  0.1 - 1.0 K/uL   Eosinophils Relative 0  0 - 5 %   Eosinophils Absolute 0.0  0.0 - 0.7 K/uL   Basophils Relative 0  0 - 1 %   Basophils Absolute 0.0  0.0 - 0.1 K/uL  BASIC METABOLIC PANEL      Result Value Range   Sodium 133 (*) 135 - 145 mEq/L   Potassium 3.9  3.5 - 5.1 mEq/L   Chloride 98  96 - 112 mEq/L   CO2 23  19 - 32 mEq/L   Glucose, Bld 123 (*) 70 - 99 mg/dL   BUN 7  6 - 23 mg/dL   Creatinine, Ser 9.56  0.50 - 1.10 mg/dL   Calcium 38.7  8.4 - 56.4 mg/dL   GFR calc non Af Amer >90  >90 mL/min   GFR calc Af Amer >90  >90 mL/min  URINALYSIS, ROUTINE W REFLEX MICROSCOPIC      Result Value Range   Color, Urine YELLOW  YELLOW   APPearance CLEAR  CLEAR   Specific Gravity, Urine >1.030 (*) 1.005 - 1.030   pH 6.0  5.0 - 8.0   Glucose, UA NEGATIVE  NEGATIVE mg/dL   Hgb urine dipstick NEGATIVE  NEGATIVE   Bilirubin Urine  MODERATE (*) NEGATIVE   Ketones, ur 40 (*) NEGATIVE mg/dL   Protein, ur 30 (*) NEGATIVE mg/dL   Urobilinogen, UA 0.2  0.0 - 1.0 mg/dL   Nitrite NEGATIVE  NEGATIVE   Leukocytes, UA NEGATIVE  NEGATIVE  PREGNANCY, URINE      Result Value Range   Preg Test, Ur NEGATIVE  NEGATIVE  COMPREHENSIVE METABOLIC PANEL      Result Value Range   Sodium 136  135 - 145 mEq/L   Potassium 4.2  3.5 - 5.1 mEq/L   Chloride 99  96 - 112 mEq/L   CO2 23  19 - 32 mEq/L   Glucose, Bld 122 (*) 70 - 99 mg/dL   BUN 7  6 - 23 mg/dL   Creatinine, Ser 3.32  0.50 - 1.10 mg/dL   Calcium 95.1  8.4 - 88.4 mg/dL   Total Protein 7.8  6.0 - 8.3 g/dL   Albumin 3.9  3.5 - 5.2 g/dL   AST 166 (*) 0 - 37 U/L   ALT 158 (*) 0 - 35 U/L   Alkaline Phosphatase 119 (*) 39 - 117 U/L   Total Bilirubin 1.3 (*) 0.3 - 1.2 mg/dL   GFR calc non Af Amer >90  >90 mL/min   GFR calc Af Amer >90  >90 mL/min  LIPASE, BLOOD      Result Value Range   Lipase 24  11 - 59 U/L  URINE MICROSCOPIC-ADD ON  Result Value Range   Squamous Epithelial / LPF RARE  RARE   WBC, UA 0-2  <3 WBC/hpf   Bacteria, UA RARE  RARE   Urine-Other MUCOUS PRESENT     Laboratory interpretation all normal except elevated LFT's, normal WBC   Imaging Review   Ct Abdomen Pelvis W Contrast  06/10/2013   CLINICAL DATA:  Abdominal pain.  EXAM: CT ABDOMEN AND PELVIS WITH CONTRAST  TECHNIQUE: Multidetector CT imaging of the abdomen and pelvis was performed using the standard protocol following bolus administration of intravenous contrast.  CONTRAST:  OMNIPAQUE IOHEXOL 300 MG/ML SOLN, 50mL OMNIPAQUE IOHEXOL 300 MG/ML SOLN  COMPARISON:  None.  FINDINGS: Visualized lung bases appear normal. The liver, spleen and pancreas appear normal. Small gallstone is noted in fundus of gallbladder. Adrenal glands and kidneys appear normal. No renal or ureteral calculi are noted. The appendix is visualized and appears normal. No evidence of bowel obstruction is noted. Urinary  bladder is decompressed. Uterus appears normal. No abnormal fluid collection is noted. Collapsed left ovarian cyst is noted measuring 2.1 mm currently.  IMPRESSION: Solitary gallstone. Collapsed left ovarian cyst measuring 2.1 cm. No other abnormality seen in the abdomen or pelvis.   Electronically Signed   By: Roque Lias M.D.   On: 06/10/2013 20:50      MDM   1. Gallstones   2. Abdominal pain   3. Nausea and vomiting     New Prescriptions   ONDANSETRON (ZOFRAN ODT) 8 MG DISINTEGRATING TABLET    Take 1 tablet (8 mg total) by mouth every 8 (eight) hours as needed for nausea.   OXYCODONE-ACETAMINOPHEN (PERCOCET/ROXICET) 5-325 MG PER TABLET    Take 1 or 2 po Q 6hrs for pain    Plan discharge   Devoria Albe, MD, FACEP    I personally performed the services described in this documentation, which was scribed in my presence. The recorded information has been reviewed and considered.  Devoria Albe, MD, Armando Gang   Ward Givens, MD 06/10/13 2130

## 2013-06-10 NOTE — ED Notes (Signed)
Pt states she wasn't feeling well last night. Epigastric pain began at ~0200. Vomiting and diarrhea began @~0700. Vomited x 3 today. Last time was at 1330

## 2013-06-11 ENCOUNTER — Ambulatory Visit (HOSPITAL_COMMUNITY)
Admit: 2013-06-11 | Discharge: 2013-06-11 | Disposition: A | Discharge status: Home / Self Care | Payer: 59 | Attending: Emergency Medicine | Admitting: Emergency Medicine

## 2013-06-11 DIAGNOSIS — K7689 Other specified diseases of liver: Secondary | ICD-10-CM | POA: Insufficient documentation

## 2013-06-11 DIAGNOSIS — R7989 Other specified abnormal findings of blood chemistry: Secondary | ICD-10-CM | POA: Insufficient documentation

## 2013-06-11 DIAGNOSIS — R1013 Epigastric pain: Secondary | ICD-10-CM | POA: Insufficient documentation

## 2013-06-11 DIAGNOSIS — K838 Other specified diseases of biliary tract: Secondary | ICD-10-CM | POA: Insufficient documentation

## 2013-06-11 DIAGNOSIS — K8020 Calculus of gallbladder without cholecystitis without obstruction: Secondary | ICD-10-CM | POA: Insufficient documentation

## 2013-06-12 NOTE — ED Provider Notes (Signed)
Catherine Bishop is a 32 y.o. female who returned for ultrasound of abdomen. When she had the ultrasound done the ER was very busy and she was asked to go home and call later about the results. She is calling today for the results. I discussed finding of gall stones. She has all the information for follow up with a general surgeon and will continue with that plan. She has not other questions at this time.   [address] [place], [place] 06/12/13 [phone]

## 2013-06-13 NOTE — ED Provider Notes (Signed)
Medical screening examination/treatment/procedure(s) were performed by non-physician practitioner and as supervising physician I was immediately available for consultation/collaboration.   Jaidev Sanger M Atley Neubert, DO 06/13/13 0706 

## 2013-06-16 ENCOUNTER — Encounter (HOSPITAL_COMMUNITY): Payer: 59 | Admitting: Anesthesiology

## 2013-06-16 ENCOUNTER — Encounter (HOSPITAL_COMMUNITY): Payer: Self-pay | Admitting: *Deleted

## 2013-06-16 ENCOUNTER — Ambulatory Visit (HOSPITAL_COMMUNITY): Payer: 59 | Admitting: Anesthesiology

## 2013-06-16 ENCOUNTER — Observation Stay (HOSPITAL_COMMUNITY)
Admission: RE | Admit: 2013-06-16 | Discharge: 2013-06-17 | Disposition: A | Discharge status: Home / Self Care | Payer: 59 | Source: Ambulatory Visit | Attending: Internal Medicine | Admitting: Internal Medicine

## 2013-06-16 ENCOUNTER — Ambulatory Visit (HOSPITAL_COMMUNITY): Payer: 59

## 2013-06-16 ENCOUNTER — Encounter (HOSPITAL_COMMUNITY)
Admission: RE | Disposition: A | Discharge status: Home / Self Care | Payer: Self-pay | Source: Ambulatory Visit | Attending: Internal Medicine

## 2013-06-16 ENCOUNTER — Other Ambulatory Visit (INDEPENDENT_AMBULATORY_CARE_PROVIDER_SITE_OTHER): Payer: Self-pay | Admitting: *Deleted

## 2013-06-16 DIAGNOSIS — Z9889 Other specified postprocedural states: Secondary | ICD-10-CM

## 2013-06-16 DIAGNOSIS — K8020 Calculus of gallbladder without cholecystitis without obstruction: Secondary | ICD-10-CM

## 2013-06-16 DIAGNOSIS — R112 Nausea with vomiting, unspecified: Secondary | ICD-10-CM

## 2013-06-16 DIAGNOSIS — K8050 Calculus of bile duct without cholangitis or cholecystitis without obstruction: Secondary | ICD-10-CM

## 2013-06-16 DIAGNOSIS — R17 Unspecified jaundice: Secondary | ICD-10-CM

## 2013-06-16 DIAGNOSIS — Z01812 Encounter for preprocedural laboratory examination: Secondary | ICD-10-CM | POA: Insufficient documentation

## 2013-06-16 HISTORY — PX: SPHINCTEROTOMY: SHX5544

## 2013-06-16 HISTORY — PX: ERCP: SHX5425

## 2013-06-16 LAB — HEPATIC FUNCTION PANEL
ALT: 519 U/L — ABNORMAL HIGH (ref 0–35)
AST: 293 U/L — ABNORMAL HIGH (ref 0–37)
Albumin: 3.5 g/dL (ref 3.5–5.2)
Alkaline Phosphatase: 264 U/L — ABNORMAL HIGH (ref 39–117)
Alkaline Phosphatase: 224 U/L — ABNORMAL HIGH (ref 39–117)
Bilirubin, Direct: 4.7 mg/dL — ABNORMAL HIGH (ref 0.0–0.3)
Indirect Bilirubin: 1.3 mg/dL — ABNORMAL HIGH (ref 0.3–0.9)
Indirect Bilirubin: 1.1 mg/dL — ABNORMAL HIGH (ref 0.3–0.9)
Total Bilirubin: 6 mg/dL — ABNORMAL HIGH (ref 0.3–1.2)
Total Bilirubin: 4.1 mg/dL — ABNORMAL HIGH (ref 0.3–1.2)
Total Protein: 7.4 g/dL (ref 6.0–8.3)

## 2013-06-16 LAB — CBC
HCT: 43.8 % (ref 36.0–46.0)
Hemoglobin: 15 g/dL (ref 12.0–15.0)
MCH: 28.4 pg (ref 26.0–34.0)
MCHC: 34.2 g/dL (ref 30.0–36.0)

## 2013-06-16 LAB — PROTIME-INR: Prothrombin Time: 12.6 seconds (ref 11.6–15.2)

## 2013-06-16 SURGERY — ERCP, WITH INTERVENTION IF INDICATED
Anesthesia: General

## 2013-06-16 MED ORDER — LEVOFLOXACIN IN D5W 500 MG/100ML IV SOLN: 500.0000 mg | INTRAVENOUS | Status: DC

## 2013-06-16 MED ORDER — MIDAZOLAM HCL 2 MG/2ML IJ SOLN
INTRAMUSCULAR | Status: AC
  Filled 2013-06-16: qty 6

## 2013-06-16 MED ORDER — SODIUM CHLORIDE 0.9 % IV SOLN
INTRAVENOUS | Status: AC
  Filled 2013-06-16: qty 50

## 2013-06-16 MED ORDER — FENTANYL CITRATE 0.05 MG/ML IJ SOLN
INTRAMUSCULAR | Status: AC
  Filled 2013-06-16: qty 2

## 2013-06-16 MED ORDER — FENTANYL CITRATE 0.05 MG/ML IJ SOLN
INTRAMUSCULAR | Status: DC | PRN
  Administered 2013-06-16: 100 ug via INTRAVENOUS

## 2013-06-16 MED ORDER — PROPOFOL 10 MG/ML IV BOLUS
INTRAVENOUS | Status: AC
  Filled 2013-06-16: qty 20

## 2013-06-16 MED ORDER — PROMETHAZINE HCL 25 MG/ML IJ SOLN
INTRAMUSCULAR | Status: AC
  Filled 2013-06-16: qty 1

## 2013-06-16 MED ORDER — GLYCOPYRROLATE 0.2 MG/ML IJ SOLN
0.2000 mg | Freq: Once | INTRAMUSCULAR | Status: AC
  Administered 2013-06-16: 0.2 mg via INTRAVENOUS

## 2013-06-16 MED ORDER — FENTANYL CITRATE 0.05 MG/ML IJ SOLN
25.0000 ug | INTRAMUSCULAR | Status: DC | PRN
  Administered 2013-06-16 (×2): 50 ug via INTRAVENOUS
  Filled 2013-06-16: qty 2

## 2013-06-16 MED ORDER — IOHEXOL 350 MG/ML SOLN
INTRAVENOUS | Status: DC | PRN
  Administered 2013-06-16: 14:00:00

## 2013-06-16 MED ORDER — ONDANSETRON HCL 4 MG PO TABS: 4.0000 mg | ORAL_TABLET | Freq: Four times a day (QID) | ORAL | Status: DC | PRN

## 2013-06-16 MED ORDER — ONDANSETRON HCL 4 MG/2ML IJ SOLN
4.0000 mg | Freq: Once | INTRAMUSCULAR | Status: AC
  Administered 2013-06-16: 4 mg via INTRAVENOUS

## 2013-06-16 MED ORDER — MIDAZOLAM HCL 2 MG/2ML IJ SOLN
1.0000 mg | INTRAMUSCULAR | Status: AC | PRN
  Administered 2013-06-16 (×3): 2 mg via INTRAVENOUS

## 2013-06-16 MED ORDER — ONDANSETRON HCL 4 MG/2ML IJ SOLN: 4.0000 mg | Freq: Four times a day (QID) | INTRAMUSCULAR | Status: DC | PRN

## 2013-06-16 MED ORDER — LEVOFLOXACIN IN D5W 500 MG/100ML IV SOLN: 500.0000 mg | Freq: Once | INTRAVENOUS | Status: DC

## 2013-06-16 MED ORDER — GLYCOPYRROLATE 0.2 MG/ML IJ SOLN
INTRAMUSCULAR | Status: DC | PRN
  Administered 2013-06-16: 0.4 mg via INTRAVENOUS

## 2013-06-16 MED ORDER — POTASSIUM CHLORIDE IN NACL 20-0.9 MEQ/L-% IV SOLN
INTRAVENOUS | Status: DC
  Administered 2013-06-16 – 2013-06-17 (×2): via INTRAVENOUS

## 2013-06-16 MED ORDER — SODIUM CHLORIDE 0.9 % IJ SOLN
INTRAMUSCULAR | Status: AC
  Filled 2013-06-16: qty 10

## 2013-06-16 MED ORDER — LACTATED RINGERS IV SOLN
INTRAVENOUS | Status: DC
  Administered 2013-06-16: 1000 mL via INTRAVENOUS

## 2013-06-16 MED ORDER — LIDOCAINE HCL (CARDIAC) 10 MG/ML IV SOLN
INTRAVENOUS | Status: DC | PRN
  Administered 2013-06-16: 20 mg via INTRAVENOUS

## 2013-06-16 MED ORDER — ONDANSETRON HCL 4 MG/2ML IJ SOLN
4.0000 mg | Freq: Once | INTRAMUSCULAR | Status: AC | PRN
  Administered 2013-06-16: 4 mg via INTRAVENOUS
  Filled 2013-06-16: qty 2

## 2013-06-16 MED ORDER — LIDOCAINE HCL (PF) 1 % IJ SOLN
INTRAMUSCULAR | Status: AC
  Filled 2013-06-16: qty 5

## 2013-06-16 MED ORDER — LEVOFLOXACIN IN D5W 500 MG/100ML IV SOLN
INTRAVENOUS | Status: AC
  Filled 2013-06-16: qty 100

## 2013-06-16 MED ORDER — LEVOFLOXACIN IN D5W 750 MG/150ML IV SOLN: 750.0000 mg | INTRAVENOUS | Status: DC

## 2013-06-16 MED ORDER — ROCURONIUM BROMIDE 50 MG/5ML IV SOLN
INTRAVENOUS | Status: AC
  Filled 2013-06-16: qty 1

## 2013-06-16 MED ORDER — GLUCAGON HCL (RDNA) 1 MG IJ SOLR
INTRAMUSCULAR | Status: AC
  Administered 2013-06-16: 0.25 mg via INTRAVENOUS
  Filled 2013-06-16: qty 1

## 2013-06-16 MED ORDER — HYDROMORPHONE HCL PF 1 MG/ML IJ SOLN: 1.0000 mg | INTRAMUSCULAR | Status: DC | PRN

## 2013-06-16 MED ORDER — ONDANSETRON HCL 4 MG/2ML IJ SOLN
INTRAMUSCULAR | Status: AC
  Filled 2013-06-16: qty 2

## 2013-06-16 MED ORDER — ROCURONIUM BROMIDE 100 MG/10ML IV SOLN
INTRAVENOUS | Status: DC | PRN
  Administered 2013-06-16: 5 mg via INTRAVENOUS
  Administered 2013-06-16: 25 mg via INTRAVENOUS

## 2013-06-16 MED ORDER — STERILE WATER FOR IRRIGATION IR SOLN
Status: DC | PRN
  Administered 2013-06-16: 14:00:00

## 2013-06-16 MED ORDER — GLYCOPYRROLATE 0.2 MG/ML IJ SOLN
INTRAMUSCULAR | Status: AC
  Filled 2013-06-16: qty 1

## 2013-06-16 MED ORDER — PROPOFOL 10 MG/ML IV BOLUS
INTRAVENOUS | Status: DC | PRN
  Administered 2013-06-16: 150 mg via INTRAVENOUS

## 2013-06-16 MED ORDER — GLYCOPYRROLATE 0.2 MG/ML IJ SOLN
INTRAMUSCULAR | Status: AC
  Filled 2013-06-16: qty 2

## 2013-06-16 MED ORDER — NEOSTIGMINE METHYLSULFATE 1 MG/ML IJ SOLN
INTRAMUSCULAR | Status: DC | PRN
  Administered 2013-06-16: 2 mg via INTRAVENOUS

## 2013-06-16 MED ORDER — PROMETHAZINE HCL 25 MG/ML IJ SOLN
6.2500 mg | INTRAMUSCULAR | Status: DC | PRN
  Administered 2013-06-16: 12.5 mg via INTRAVENOUS

## 2013-06-16 SURGICAL SUPPLY — 24 items
BAG HAMPER (MISCELLANEOUS) ×2 IMPLANT
BALLN RETRIEVAL 12X15 (BALLOONS) ×2 IMPLANT
BASKET TRAPEZOID 3X6 (MISCELLANEOUS) IMPLANT
DEVICE INFLATION ENCORE 26 (MISCELLANEOUS) IMPLANT
DEVICE LOCKING W-BIOPSY CAP (MISCELLANEOUS) ×2 IMPLANT
GUIDEWIRE HYDRA JAGWIRE .35 (WIRE) IMPLANT
GUIDEWIRE JAG HINI 025X260CM (WIRE) IMPLANT
KIT ROOM TURNOVER APOR (KITS) ×2 IMPLANT
LUBRICANT JELLY 4.5OZ STERILE (MISCELLANEOUS) ×2 IMPLANT
NEEDLE HYPO 18GX1.5 BLUNT FILL (NEEDLE) ×2 IMPLANT
PAD ARMBOARD 7.5X6 YLW CONV (MISCELLANEOUS) ×4 IMPLANT
PATHFINDER 450CM 0.18 (STENTS) IMPLANT
POSITIONER HEAD 8X9X4 ADT (SOFTGOODS) ×2 IMPLANT
SNARE ROTATE MED OVAL 20MM (MISCELLANEOUS) IMPLANT
SPHINCTEROTOME AUTOTOME .25 (MISCELLANEOUS) ×2 IMPLANT
SPHINCTEROTOME HYDRATOME 44 (MISCELLANEOUS) ×2 IMPLANT
SPONGE GAUZE 4X4 12PLY (GAUZE/BANDAGES/DRESSINGS) ×2 IMPLANT
SYR 3ML LL SCALE MARK (SYRINGE) ×2 IMPLANT
SYR 50ML LL SCALE MARK (SYRINGE) ×4 IMPLANT
SYSTEM CONTINUOUS INJECTION (MISCELLANEOUS) ×2 IMPLANT
TUBING ENDO SMARTCAP PENTAX (MISCELLANEOUS) ×2 IMPLANT
WALLSTENT METAL COVERED 10X60 (STENTS) IMPLANT
WALLSTENT METAL COVERED 10X80 (STENTS) IMPLANT
WATER STERILE IRR 1000ML POUR (IV SOLUTION) ×2 IMPLANT

## 2013-06-16 NOTE — Anesthesia Postprocedure Evaluation (Signed)
Anesthesia Post Note  Patient: Catherine Bishop  Procedure(s) Performed: Procedure(s) (LRB): ENDOSCOPIC RETROGRADE CHOLANGIOPANCREATOGRAPHY (ERCP) (N/A) SPHINCTEROTOMY, STONE EXTRACTION (N/A)  Anesthesia type: General  Patient location: PACU  Post pain: Pain level controlled  Post assessment: Post-op Vital signs reviewed, Patient's Cardiovascular Status Stable, Respiratory Function Stable, Patent Airway, No signs of Nausea or vomiting and Pain level controlled  Last Vitals:  Filed Vitals:   06/16/13 1410  BP: 124/65  Pulse:   Temp: 36.7 C  Resp: 18    Post vital signs: Reviewed and stable  Level of consciousness: awake and alert   Complications: No apparent anesthesia complications

## 2013-06-16 NOTE — Progress Notes (Signed)
Eyeglasses returned to pt. 

## 2013-06-16 NOTE — Progress Notes (Signed)
Awake. C/O nausea. Emesis of 200 ml of green fluid.

## 2013-06-16 NOTE — Progress Notes (Signed)
Awake. Continues with nausea and vomiting. Dr Jayme Cloud notified. Order given.

## 2013-06-16 NOTE — H&P (Signed)
Catherine Bishop is an 32 y.o. female.   Chief Complaint: Patient is here for ERCP. HPI: Patient is 32 year old Caucasian female who was in usual state of health until early morning of 06/10/2013 when she woke up with excruciating mid epigastric pain as well as nausea and vomiting. She was evaluated in emergency room and noted to have elevated transaminases. Abdominal pelvic CT revealed gallstone and left ovarian cyst. She also had upper abdominal ultrasound a day later which showed gallstones and dilated common bile duct. Patient has remained with intermittent epigastric pain nausea and sporadic vomiting. She has been afraid to eat and has lost 12 pounds. She has noted her urine to be tea-colored. She recalls she has had few episodes of epigastric pain but these were mild and resolved spontaneously. On her ER visit she was given prescription for ondansetron and pain medication. He does not take any medications on regular basis. She states the most that she or her weight was 230 pounds.  Past Medical History  Diagnosis Date  . Pelvic pain in female   . Left ovarian cyst   . Irregular bleeding   . Tilted uterus   . GDM (gestational diabetes mellitus)   . Epilepsy     childhood  . Gestational diabetes     diet controlled  . Anxiety   . Hypertension        Obesity.  Past Surgical History  Procedure Laterality Date  . Wisdom tooth extraction      Family History  Problem Relation Age of Onset  . Cancer Maternal Grandmother     renal  . Diabetes Maternal Grandmother   . Heart disease Maternal Grandfather   . Heart attack Maternal Grandfather    Social History:  reports that she quit smoking about 4 years ago. Her smoking use included Cigarettes. She smoked 0.50 packs per day. She has never used smokeless tobacco. She reports that she does not drink alcohol or use illicit drugs.  Allergies:  Allergies  Allergen Reactions  . Penicillins     rash  . Latex Rash    Medications Prior  to Admission  Medication Sig Dispense Refill  . JENCYCLA 0.35 MG tablet Take 1 tablet by mouth daily.      . ondansetron (ZOFRAN ODT) 8 MG disintegrating tablet Take 1 tablet (8 mg total) by mouth every 8 (eight) hours as needed for nausea.  10 tablet  0  . oxyCODONE-acetaminophen (PERCOCET/ROXICET) 5-325 MG per tablet Take 1 or 2 po Q 6hrs for pain  16 tablet  0  . triamcinolone cream (KENALOG) 0.1 % Apply 1 application topically as needed.        Results for orders placed during the hospital encounter of 06/16/13 (from the past 48 hour(s))  CBC     Status: Abnormal   Collection Time    06/16/13 12:07 PM      Result Value Range   WBC 7.6  4.0 - 10.5 K/uL   RBC 5.28 (*) 3.87 - 5.11 MIL/uL   Hemoglobin 15.0  12.0 - 15.0 g/dL   HCT 16.1  09.6 - 04.5 %   MCV 83.0  78.0 - 100.0 fL   MCH 28.4  26.0 - 34.0 pg   MCHC 34.2  30.0 - 36.0 g/dL   RDW 40.9  81.1 - 91.4 %   Platelets 317  150 - 400 K/uL  PROTIME-INR     Status: None   Collection Time    06/16/13 12:07 PM  Result Value Range   Prothrombin Time 12.6  11.6 - 15.2 seconds   INR 0.96  0.00 - 1.49   Imaging studies reviewed with patient and her mother.  ROS  Last menstrual period 06/13/2013. Physical Exam  Constitutional: She appears well-developed and well-nourished.  HENT:  Mouth/Throat: Oropharynx is clear and moist.  Eyes: Conjunctivae are normal. Scleral icterus is present.  Neck: No thyromegaly present.  Cardiovascular: Normal rate, regular rhythm and normal heart sounds.   No murmur heard. Respiratory: Effort normal and breath sounds normal.  GI: Soft. She exhibits no distension and no mass. There is no tenderness.  Musculoskeletal: She exhibits no edema.  Lymphadenopathy:    She has no cervical adenopathy.  Neurological: She is alert.  Skin: Skin is warm and dry.  Small area of rash around right nares     Assessment/Plan Patient is a 32 year old Caucasian female who presents with a one-week history of  epigastric pain with nausea and vomiting and on workup found to have cholelithiasis dilated bile duct and now is jaundiced. She was evaluated by Dr. Barbaraann Barthel earlier today recommended ERCP prior to cholecystectomy. Her acute symptoms would appear to be due to biliary colic or choledocholithiasis and she needs to have her duct cleared prior to cholecystectomy. Patient will undergo ERCP with sphincterotomy with stone extraction. Procedure and risks were reviewed with patient in detail and she is agreeable. She was also informed that pancreatic stenting will be undertaken only if access to bile duct is difficult in order to minimize risk of pancreatitis. INR is 0.96 and LFTs are pending.    REHMAN,Catherine Bishop U 06/16/2013, 12:27 PM

## 2013-06-16 NOTE — Op Note (Signed)
ERCP PROCEDURE REPORT  PATIENT:  Catherine Bishop  MR#:  [id number] Birthdate:  Apr 18, 1981, 32 y.o., female Endoscopist:  Dr. Malissa Hippo, MD Referred By:  Dr. Marlane Hatcher, MD  Procedure Date: 06/16/2013  Procedure:   ERCP with sphincterotomy and stone extraction.    Indications:  Patient is 32 year old Caucasian female who presents with epigastric pain, jaundice and imaging studies revealed cholelithiasis and dilated bile duct. She is suspected to have common duct stone. She was evaluated by Dr. Barbaraann Barthel earlier today and sent over for therapeutic ERCP prior to cholecystecto            Informed Consent:  The risks, benefits, limitations, alternatives, and mponderable have been reviewed with the patient. I specifically discussed a 1 in 10 chance of pancreatitis, reaction to medications, bleeding, perforation and the possibility of a failed ERCP. Potential for sphincterotomy and stent placement also reviewed. Questions have been answered. All parties agreeable.  Please see history & physical in medical record for more information.  Medications:  *Please see anesthesia record for complete details  Description of procedure:  Procedure performed in the OR. The patient was placed under anesthesia, intubated, and turned into semipermanent position. Therapeutic Pentax video duodenoscope passed through the oropharynx without any difficulty into the esophagus, stomach, and across the pylorus and pull, and descending duodenum.  Bile duct was easily cannulated with Rx 44 ordered tone and 035 hydrajag wire and diluted contrast injected. Pancreatic duct was not cannulated or filled with contrast.   Findings:  Prominent ampulla of water with long intramural segment. Dilated CBD and CHD measuring about 10 mm. Prominent intrahepatic biliary radicles. Single defect in distal most segment of CBD consistent with stone. Biliary sphincterotomy performed and stone removed using stone balloon  extractor. Stone was about 7-8 mm in mixed stone(yellow and pigmented stone)   Therapeutic/Diagnostic Maneuvers Performed:   Biliary sphincterotomy performed and stone removed with stone balloon extractor.  Complications:  None  Impression:  Dilated biliary system with single stone which was removed following sphincterotomy. Pancreatic duct was not filled with contrast or cannulated.  Recommendations:  Clear liquids today. No aspirin or NSAIDs for 72 hours. Cholecystectomy next week per Dr. Malvin Johns. Repeat LFTs prior to cholecystectomy.  REHMAN,NAJEEB U  06/16/2013  2:08 PM  CC: Dr. Thayer Headings, MD & Dr. Bonnetta Barry ref. provider found  CC: Dr. Vilinda Blanks. Malvin Johns, MD

## 2013-06-16 NOTE — Consult Note (Deleted)
NAME:  Catherine Bishop, Catherine Bishop              ACCOUNT NO.:  [id number]  MEDICAL RECORD NO.:  [id number]  LOCATION:  [facility]                          FACILITY:  [facility]  PHYSICIAN:  Mirl Hillery, M.D. DATE OF BIRTH:  04/06/1981  DATE OF CONSULTATION:  06/16/2013 DATE OF DISCHARGE:                                CONSULTATION   NOTE:  This is a 32-year-old white female who was referred from the emergency room for gallbladder disease.  In essence, she gives a history of 1 week of abdominal pain that is postprandial in nature and accompanied with nausea and vomiting.  This happened last Friday, a week ago, and this began with right upper quadrant pain that was postprandial and radiated to her back on Friday and on Saturday morning, the pain was considerably worse and she came to the emergency room where she was worked up.  A CT scan and a sonogram were done and revealed the presence of a dilated common bile duct and cholelithiasis.  No obvious choledocholithiasis was noted.  Her liver enzymes were also elevated and in the interim of her weeks' worth of illness, she has noted that her urine has become darker and her stools more clay-colored and she obviously appears somewhat jaundiced.  She came to my office today.  I reviewed her CT scan and sonogram and discussed this case with Dr. Rehman who is kind enough to see her right away in the short-stay surgical department where she has been sent for an ERCP.  We discussed that if this is a routine procedure from the GI standpoint, she will be sent home and I will follow up with her on Monday and make plans for an interim laparoscopic cholecystectomy.  I also advised the patient after the ERCP to remain on a fairly fat free clear liquid diet.  PAST MEDICAL HISTORY:  Fairly unremarkable.  She has had no previous surgery.  ALLERGIES:  She is allergic to penicillin and latex.  SOCIAL HISTORY:  She is a nonsmoker, nondrinker.  MEDICATIONS:  For her  medications, see consult medication list.  She takes birth control pills.  PHYSICAL EXAMINATION:  VITAL SIGNS:  She is 5 feet 6 inches, weighs 217 pounds.  Her temperature is 98.4, pulse rate is 80, respirations are 14, blood pressure 140/80. HEENT:  Head is normocephalic.  Eyes, extraocular movements are intact. Pupils are round and reactive to light and accommodation.  There is noted icteric tincture to the sclerae.  Her nose and oral mucosa are moist.  She has her tongue pierced. NECK:  She has no cervical adenopathy, thyromegaly, tracheal deviation, or bruits appreciated. CHEST:  Clear both anterior and posterior auscultation. HEART:  Regular rhythm. BREASTS:  Breasts and axillae are without masses. ABDOMEN:  She is obese as she has some tenderness in the right upper quadrant.  No obvious rebound tenderness.  No hernias. RECTAL:  Deferred as this patient is currently having her menses. EXTREMITIES:  Within normal limits.  REVIEW OF SYSTEMS:  NEUROLOGIC:  No obvious neurological localizing symptoms.  No history of migraines or seizures.  The patient does suffer from panic attacks.  ENDOCRINE:  No history of diabetes, thyroid disease, or adrenal problems.    CARDIOPULMONARY:  Within normal limits. MUSCULOSKELETAL:  The patient is obese.  OB/GYN:  She is a gravida 2, para 2, cesarean 0, abortus 0 female who is currently having her menses and has no family history of carcinoma of the breast.  GI:  No past history of hepatitis, constipation, diarrhea, bright red rectal bleeding, inflammatory bowel disease, or irritable bowel syndrome or any history of weight loss.  She does as stated has a history of right upper quadrant pain radiating to her back with nausea and vomiting, this has been going on for to a lesser extent for a year after the birth of her last child and has been exacerbated over the last week sufficient enough to go see the emergency room.  She has never had an upper GI or  lower GI endoscopy.  GU:  No dysuria, frequency, or history of nephrolithiasis.  REVIEW OF HISTORY AND PHYSICAL:  Therefore, [name] is a 32-year-old obese white female who has signs and symptoms of choledocholithiasis as well as documented cholelithiasis.  The plan is to proceed with an ERCP as per Dr. Rehman's schedule and interim laparoscopic cholecystectomy afterwards.     Serafino Burciaga, M.D.     WB/MEDQ  D:  06/16/2013  T:  06/16/2013  Job:  [id number] 

## 2013-06-16 NOTE — Transfer of Care (Signed)
Immediate Anesthesia Transfer of Care Note  Patient: Oletha Cruel  Procedure(s) Performed: Procedure(s) (LRB): ENDOSCOPIC RETROGRADE CHOLANGIOPANCREATOGRAPHY (ERCP) (N/A) SPHINCTEROTOMY, STONE EXTRACTION (N/A)  Patient Location: PACU  Anesthesia Type: General  Level of Consciousness: awake  Airway & Oxygen Therapy: Patient Spontanous Breathing and non-rebreather face mask  Post-op Assessment: Report given to PACU RN, Post -op Vital signs reviewed and stable and Patient moving all extremities  Post vital signs: Reviewed and stable  Complications: No apparent anesthesia complications

## 2013-06-16 NOTE — Consult Note (Signed)
NAMETAKEYSHA, BONK              ACCOUNT NO.:  [id number]  MEDICAL RECORD NO.:  [id number]  LOCATION:  [facility]                          FACILITY:  [facility]  PHYSICIAN:  Barbaraann Barthel, M.D. DATE OF BIRTH:  [birth year]  DATE OF CONSULTATION:  06/16/2013 DATE OF DISCHARGE:                                CONSULTATION   NOTE:  This is a 32 year old white female who was referred from the emergency room for gallbladder disease.  In essence, she gives a history of 1 week of abdominal pain that is postprandial in nature and accompanied with nausea and vomiting.  This happened last Friday, a week ago, and this began with right upper quadrant pain that was postprandial and radiated to her back on Friday and on Saturday morning, the pain was considerably worse and she came to the emergency room where she was worked up.  A CT scan and a sonogram were done and revealed the presence of a dilated common bile duct and cholelithiasis.  No obvious choledocholithiasis was noted.  Her liver enzymes were also elevated and in the interim of her weeks' worth of illness, she has noted that her urine has become darker and her stools more clay-colored and she obviously appears somewhat jaundiced.  She came to my office today.  I reviewed her CT scan and sonogram and discussed this case with Dr. Karilyn Cota who is kind enough to see her right away in the short-stay surgical department where she has been sent for an ERCP.  We discussed that if this is a routine procedure from the GI standpoint, she will be sent home and I will follow up with her on Monday and make plans for an interim laparoscopic cholecystectomy.  I also advised the patient after the ERCP to remain on a fairly fat free clear liquid diet.  PAST MEDICAL HISTORY:  Fairly unremarkable.  She has had no previous surgery.  ALLERGIES:  She is allergic to penicillin and latex.  SOCIAL HISTORY:  She is a nonsmoker, nondrinker.  MEDICATIONS:  For her  medications, see consult medication list.  She takes birth control pills.  PHYSICAL EXAMINATION:  VITAL SIGNS:  She is 5 feet 6 inches, weighs 217 pounds.  Her temperature is 98.4, pulse rate is 80, respirations are 14, blood pressure 140/80. HEENT:  Head is normocephalic.  Eyes, extraocular movements are intact. Pupils are round and reactive to light and accommodation.  There is noted icteric tincture to the sclerae.  Her nose and oral mucosa are moist.  She has her tongue pierced. NECK:  She has no cervical adenopathy, thyromegaly, tracheal deviation, or bruits appreciated. CHEST:  Clear both anterior and posterior auscultation. HEART:  Regular rhythm. BREASTS:  Breasts and axillae are without masses. ABDOMEN:  She is obese as she has some tenderness in the right upper quadrant.  No obvious rebound tenderness.  No hernias. RECTAL:  Deferred as this patient is currently having her menses. EXTREMITIES:  Within normal limits.  REVIEW OF SYSTEMS:  NEUROLOGIC:  No obvious neurological localizing symptoms.  No history of migraines or seizures.  The patient does suffer from panic attacks.  ENDOCRINE:  No history of diabetes, thyroid disease, or adrenal problems.  CARDIOPULMONARY:  Within normal limits. MUSCULOSKELETAL:  The patient is obese.  OB/GYN:  She is a gravida 2, para 2, cesarean 0, abortus 0 female who is currently having her menses and has no family history of carcinoma of the breast.  GI:  No past history of hepatitis, constipation, diarrhea, bright red rectal bleeding, inflammatory bowel disease, or irritable bowel syndrome or any history of weight loss.  She does as stated has a history of right upper quadrant pain radiating to her back with nausea and vomiting, this has been going on for to a lesser extent for a year after the birth of her last child and has been exacerbated over the last week sufficient enough to go see the emergency room.  She has never had an upper GI or  lower GI endoscopy.  GU:  No dysuria, frequency, or history of nephrolithiasis.  REVIEW OF HISTORY AND PHYSICAL:  Therefore, Ms. Donahoo is a 32 year old obese white female who has signs and symptoms of choledocholithiasis as well as documented cholelithiasis.  The plan is to proceed with an ERCP as per Dr. Patty Sermons schedule and interim laparoscopic cholecystectomy afterwards.     Barbaraann Barthel, M.D.     WB/MEDQ  D:  06/16/2013  T:  06/16/2013  Job:  [id number]

## 2013-06-16 NOTE — Progress Notes (Signed)
Patient remains with nausea and vomiting despite use of anti-emetics. She has no abdominal pain. Nausea vomiting felt to be secondary to anesthesia. She had an eventful ERCP with stone extraction. Doubt that she has acute cholecystitis in addition to choledocholithiasis. Patient will be admitted as observation.

## 2013-06-16 NOTE — Anesthesia Preprocedure Evaluation (Addendum)
Anesthesia Evaluation  Patient identified by MRN, date of birth, ID band Patient awake    Reviewed: Allergy & Precautions, H&P , Patient's Chart, lab work & pertinent test results  Airway Mallampati: III TM Distance: >3 FB Neck ROM: full    Dental no notable dental hx.    Pulmonary neg pulmonary ROS,  breath sounds clear to auscultation  Pulmonary exam normal       Cardiovascular hypertension, negative cardio ROS  Rhythm:regular Rate:Normal     Neuro/Psych Seizures -,  PSYCHIATRIC DISORDERS Anxiety negative neurological ROS  negative psych ROS   GI/Hepatic negative GI ROS, Neg liver ROS,   Endo/Other  negative endocrine ROSdiabetesMorbid obesity  Renal/GU negative Renal ROS     Musculoskeletal   Abdominal   Peds  Hematology negative hematology ROS (+)   Anesthesia Other Findings  Pelvic pain in female     Left ovarian cyst        Irregular bleeding     Tilted uterus        GDM (gestational diabetes mellitus)     Hypertension        Epilepsy   childhood Gestational diabetes   diet controlled    Anxiety    Reproductive/Obstetrics                           Anesthesia Physical Anesthesia Plan  ASA: III  Anesthesia Plan: General   Post-op Pain Management:    Induction: Intravenous, Rapid sequence and Cricoid pressure planned  Airway Management Planned: Oral ETT  Additional Equipment:   Intra-op Plan:   Post-operative Plan: Extubation in OR  Informed Consent: I have reviewed the patients History and Physical, chart, labs and discussed the procedure including the risks, benefits and alternatives for the proposed anesthesia with the patient or authorized representative who has indicated his/her understanding and acceptance.     Plan Discussed with:   Anesthesia Plan Comments:         Anesthesia Quick Evaluation  

## 2013-06-17 LAB — BASIC METABOLIC PANEL
BUN: 7 mg/dL (ref 6–23)
CO2: 24 mEq/L (ref 19–32)
Calcium: 9.1 mg/dL (ref 8.4–10.5)
Creatinine, Ser: 0.72 mg/dL (ref 0.50–1.10)
GFR calc Af Amer: >90 mL/min (ref >90)
Glucose, Bld: 79 mg/dL (ref 70–99)
Sodium: 138 mEq/L (ref 135–145)

## 2013-06-17 NOTE — Discharge Planning (Signed)
Pt stated she was ready to go home and she had no pain.  Pt's IV was removed and she was given and explained DC papers. She was also educated on what S/SX would warrant calling the doctor or returning to the hospital.  Pt will be walked to car by staff when ready.

## 2013-06-17 NOTE — Progress Notes (Signed)
Subjective; Patient feels fine. She has no nausea vomiting or abdominal pain. She had no difficulty with clear liquids. Her appetite is back.  Objective; BP 101/67  Pulse 68  Temp(Src) 98.2 F (36.8 C) (Oral)  Resp 16  Ht 5\' 6"  (1.676 m)  Wt 218 lb (98.884 kg)  BMI 35.2 kg/m2  SpO2 97%  LMP 06/13/2013 Sclerae remains mildly icteric. Abdomen is soft and nontender without organomegaly or masses.  Lab data; Total bilirubin 3.0. AP 224 AST to 93 and ALT 519 Albumin 3.5  serum amylase 32 Serum sodium 138, potassium 4.0, chloride 103, CO2 24, glucose 79, BUN 7, creatinine 0.72. Calcium 9.1.  Assessment; #1. Choledocholithiasis. Status post ERCP with sphincterotomy and stone extraction yesterday afternoon. Significant drop in bilirubin and transaminases. #2. Nausea and vomiting secondary to general anesthesia and reason for observation. The symptoms have resolved completely.  #3. Cholelithiasis. Patient to followup with Dr. Malvin Johns next week as she will need cholecystectomy.  Recommendations; Full liquids. Home after lunch. Office visit with Dr. Malvin Johns on 06/19/2013. She will have LFTs prior to cholecystectomy.

## 2013-06-17 NOTE — Discharge Summary (Signed)
Principal diagnoses. Nausea and vomiting. Choledocholithiasis. Cholelithiasis.  Miscellaneous diagnosis. Obesity.  Date of admission/observation; 06/16/2013. Date of discharge; 06/17/2013.  Principal procedure. ERCP with sphincterotomy and stone extraction on 06/16/2013.  Condition at time of discharge. Improved.  Discharge medications.   Medication List         JENCYCLA 0.35 MG tablet  Generic drug:  norethindrone  Take 1 tablet by mouth daily.     ondansetron 8 MG disintegrating tablet  Commonly known as:  ZOFRAN ODT  Take 1 tablet (8 mg total) by mouth every 8 (eight) hours as needed for nausea.     oxyCODONE-acetaminophen 5-325 MG per tablet  Commonly known as:  PERCOCET/ROXICET  Take 1 tablet by mouth every 4 (four) hours as needed for pain.       Hospital course; Patient is 32 year old Caucasian female who was referred through courtesy of Dr. Barbaraann Barthel for ERCP. Patient's symptoms upper abdominal pain nausea and vomiting began one week ago when she was evaluated in emergency room and noted to have elevated transaminases dilated bile duct and cholelithiasis. Patient was seen by Dr. Malvin Johns earlier in the day and noted to be jaundiced and he fell that she had common duct stone and her duct needed to be cleared before cholecystectomy. Preprocedure patient's bilirubin was 4.7, AP was 264, AST 369 and ALT 6:15. CBC and INR were normal. Patient underwent ERCP revealing dilated biliary system and single stone which was removed following sphincterotomy. While in recovery patient was not able to tolerate liquids despite antiemetics. Her acute nausea and vomiting was felt to be secondary to anesthesia. She was therefore hospitalized for IV fluids and therapy. By the end of the day she was feeling better and tolerated clear liquids. She had no difficulty with clear liquids this morning and full liquid diet at lunch. She had no abdominal pain. Lab studies were repeated and a  bilirubin was down to 3 and similarly AST and ALT were down to 293 and 519 respectively. Serum amylase was normal at 32. Antibiotics 7 was also within normal limits. Patient felt fine and discharged to be followed by Dr. Malvin Johns next week for cholecystectomy. She will have LFTs repeated prior to this procedure. During this brief hospitalization we also talked about her high BMI and need for regular exercise and dietary changes.  CC Dr. Marlane Hatcher MD

## 2013-06-19 ENCOUNTER — Encounter (HOSPITAL_COMMUNITY): Payer: Self-pay

## 2013-06-19 ENCOUNTER — Encounter (HOSPITAL_COMMUNITY): Payer: Self-pay | Admitting: Internal Medicine

## 2013-06-20 ENCOUNTER — Encounter (HOSPITAL_COMMUNITY)
Admission: RE | Admit: 2013-06-20 | Discharge: 2013-06-20 | Disposition: A | Discharge status: Home / Self Care | Payer: 59 | Source: Ambulatory Visit | Attending: General Surgery | Admitting: General Surgery

## 2013-06-21 ENCOUNTER — Encounter (HOSPITAL_COMMUNITY)
Admission: RE | Disposition: A | Discharge status: Home / Self Care | Payer: Self-pay | Source: Ambulatory Visit | Attending: General Surgery

## 2013-06-21 ENCOUNTER — Ambulatory Visit (HOSPITAL_COMMUNITY)
Admission: RE | Admit: 2013-06-21 | Discharge: 2013-06-22 | Disposition: A | Discharge status: Home / Self Care | Payer: 59 | Source: Ambulatory Visit | Attending: General Surgery | Admitting: General Surgery

## 2013-06-21 ENCOUNTER — Encounter (HOSPITAL_COMMUNITY): Payer: Self-pay | Admitting: *Deleted

## 2013-06-21 ENCOUNTER — Encounter (HOSPITAL_COMMUNITY): Payer: 59 | Admitting: Anesthesiology

## 2013-06-21 ENCOUNTER — Ambulatory Visit (HOSPITAL_COMMUNITY): Payer: 59 | Admitting: Anesthesiology

## 2013-06-21 DIAGNOSIS — Z01812 Encounter for preprocedural laboratory examination: Secondary | ICD-10-CM | POA: Insufficient documentation

## 2013-06-21 DIAGNOSIS — K8010 Calculus of gallbladder with chronic cholecystitis without obstruction: Secondary | ICD-10-CM | POA: Insufficient documentation

## 2013-06-21 HISTORY — PX: CHOLECYSTECTOMY: SHX55

## 2013-06-21 LAB — HEPATIC FUNCTION PANEL
Albumin: 4.6 g/dL (ref 3.5–5.2)
Indirect Bilirubin: 0.6 mg/dL (ref 0.3–0.9)
Total Bilirubin: 1.3 mg/dL — ABNORMAL HIGH (ref 0.3–1.2)
Total Protein: 8.2 g/dL (ref 6.0–8.3)

## 2013-06-21 SURGERY — LAPAROSCOPIC CHOLECYSTECTOMY
Anesthesia: General | Site: Abdomen | Wound class: Clean Contaminated

## 2013-06-21 MED ORDER — MORPHINE SULFATE 2 MG/ML IJ SOLN
1.0000 mg | INTRAMUSCULAR | Status: DC | PRN
  Administered 2013-06-21: 1 mg via INTRAVENOUS
  Filled 2013-06-21: qty 1

## 2013-06-21 MED ORDER — MIDAZOLAM HCL 2 MG/2ML IJ SOLN
1.0000 mg | INTRAMUSCULAR | Status: AC | PRN
  Administered 2013-06-21 (×3): 2 mg via INTRAVENOUS
  Filled 2013-06-21 (×2): qty 2

## 2013-06-21 MED ORDER — FENTANYL CITRATE 0.05 MG/ML IJ SOLN
INTRAMUSCULAR | Status: AC
  Filled 2013-06-21: qty 5

## 2013-06-21 MED ORDER — ONDANSETRON HCL 4 MG/2ML IJ SOLN
INTRAMUSCULAR | Status: DC | PRN
  Administered 2013-06-21: 4 mg via INTRAVENOUS

## 2013-06-21 MED ORDER — GLYCOPYRROLATE 0.2 MG/ML IJ SOLN
INTRAMUSCULAR | Status: DC | PRN
  Administered 2013-06-21: 0.4 mg via INTRAVENOUS

## 2013-06-21 MED ORDER — HYDROMORPHONE HCL PF 1 MG/ML IJ SOLN
1.0000 mg | INTRAMUSCULAR | Status: DC | PRN
  Administered 2013-06-21 – 2013-06-22 (×5): 1 mg via INTRAVENOUS
  Filled 2013-06-21 (×5): qty 1

## 2013-06-21 MED ORDER — PROMETHAZINE HCL 25 MG/ML IJ SOLN
INTRAMUSCULAR | Status: AC
  Filled 2013-06-21: qty 1

## 2013-06-21 MED ORDER — PROMETHAZINE HCL 25 MG/ML IJ SOLN
12.5000 mg | INTRAMUSCULAR | Status: AC | PRN
  Administered 2013-06-21 (×2): 12.5 mg via INTRAVENOUS

## 2013-06-21 MED ORDER — ONDANSETRON HCL 4 MG/2ML IJ SOLN
4.0000 mg | Freq: Once | INTRAMUSCULAR | Status: AC
  Administered 2013-06-21: 4 mg via INTRAVENOUS

## 2013-06-21 MED ORDER — ONDANSETRON HCL 4 MG/2ML IJ SOLN
INTRAMUSCULAR | Status: AC
  Filled 2013-06-21: qty 2

## 2013-06-21 MED ORDER — BUPIVACAINE HCL (PF) 0.5 % IJ SOLN
INTRAMUSCULAR | Status: DC | PRN
  Administered 2013-06-21: 7 mL
  Administered 2013-06-21: 6 mL

## 2013-06-21 MED ORDER — SODIUM CHLORIDE 0.9 % IJ SOLN
INTRAMUSCULAR | Status: AC
  Filled 2013-06-21: qty 10

## 2013-06-21 MED ORDER — FENTANYL CITRATE 0.05 MG/ML IJ SOLN
INTRAMUSCULAR | Status: AC
  Filled 2013-06-21: qty 2

## 2013-06-21 MED ORDER — FENTANYL CITRATE 0.05 MG/ML IJ SOLN
25.0000 ug | INTRAMUSCULAR | Status: DC | PRN
  Administered 2013-06-21 (×2): 50 ug via INTRAVENOUS
  Filled 2013-06-21: qty 2

## 2013-06-21 MED ORDER — HEMOSTATIC AGENTS (NO CHARGE) OPTIME
TOPICAL | Status: DC | PRN
  Administered 2013-06-21: 1 via TOPICAL

## 2013-06-21 MED ORDER — PROPOFOL 10 MG/ML IV BOLUS
INTRAVENOUS | Status: DC | PRN
  Administered 2013-06-21: 30 mg via INTRAVENOUS
  Administered 2013-06-21: 20 mg via INTRAVENOUS
  Administered 2013-06-21: 150 mg via INTRAVENOUS

## 2013-06-21 MED ORDER — LORAZEPAM 0.5 MG PO TABS
0.5000 mg | ORAL_TABLET | Freq: Every day | ORAL | Status: DC
  Administered 2013-06-21: 0.5 mg via ORAL
  Filled 2013-06-21: qty 1

## 2013-06-21 MED ORDER — ACETAMINOPHEN 325 MG PO TABS
650.0000 mg | ORAL_TABLET | ORAL | Status: DC | PRN
  Administered 2013-06-21: 650 mg via ORAL
  Filled 2013-06-21: qty 2

## 2013-06-21 MED ORDER — ONDANSETRON HCL 4 MG/2ML IJ SOLN: 4.0000 mg | Freq: Once | INTRAMUSCULAR | Status: DC | PRN

## 2013-06-21 MED ORDER — BUPIVACAINE HCL (PF) 0.5 % IJ SOLN
INTRAMUSCULAR | Status: AC
  Filled 2013-06-21: qty 30

## 2013-06-21 MED ORDER — LIDOCAINE HCL (CARDIAC) 10 MG/ML IV SOLN
INTRAVENOUS | Status: DC | PRN
  Administered 2013-06-21: 20 mg via INTRAVENOUS

## 2013-06-21 MED ORDER — ONDANSETRON HCL 4 MG/2ML IJ SOLN
4.0000 mg | Freq: Four times a day (QID) | INTRAMUSCULAR | Status: DC | PRN
  Administered 2013-06-21: 4 mg via INTRAVENOUS
  Filled 2013-06-21: qty 2

## 2013-06-21 MED ORDER — PROPOFOL 10 MG/ML IV EMUL
INTRAVENOUS | Status: AC
  Filled 2013-06-21: qty 20

## 2013-06-21 MED ORDER — WATER FOR IRRIGATION, STERILE IR SOLN
Status: DC | PRN
  Administered 2013-06-21 (×2): 1000 mL

## 2013-06-21 MED ORDER — SODIUM CHLORIDE 0.9 % IR SOLN
Status: DC | PRN
  Administered 2013-06-21: 1000 mL

## 2013-06-21 MED ORDER — FENTANYL CITRATE 0.05 MG/ML IJ SOLN
INTRAMUSCULAR | Status: DC | PRN
  Administered 2013-06-21: 50 ug via INTRAVENOUS
  Administered 2013-06-21: 100 ug via INTRAVENOUS
  Administered 2013-06-21: 50 ug via INTRAVENOUS
  Administered 2013-06-21: 25 ug via INTRAVENOUS
  Administered 2013-06-21 (×2): 50 ug via INTRAVENOUS
  Administered 2013-06-21: 25 ug via INTRAVENOUS

## 2013-06-21 MED ORDER — NEOSTIGMINE METHYLSULFATE 1 MG/ML IJ SOLN
INTRAMUSCULAR | Status: DC | PRN
  Administered 2013-06-21: 2 mg via INTRAVENOUS

## 2013-06-21 MED ORDER — CIPROFLOXACIN IN D5W 400 MG/200ML IV SOLN
INTRAVENOUS | Status: AC
  Filled 2013-06-21: qty 200

## 2013-06-21 MED ORDER — MIDAZOLAM HCL 2 MG/2ML IJ SOLN
INTRAMUSCULAR | Status: AC
  Filled 2013-06-21: qty 2

## 2013-06-21 MED ORDER — ONDANSETRON HCL 4 MG PO TABS: 4.0000 mg | ORAL_TABLET | Freq: Four times a day (QID) | ORAL | Status: DC | PRN

## 2013-06-21 MED ORDER — ROCURONIUM BROMIDE 50 MG/5ML IV SOLN
INTRAVENOUS | Status: AC
  Filled 2013-06-21: qty 1

## 2013-06-21 MED ORDER — ROCURONIUM BROMIDE 100 MG/10ML IV SOLN
INTRAVENOUS | Status: DC | PRN
  Administered 2013-06-21: 40 mg via INTRAVENOUS

## 2013-06-21 MED ORDER — CIPROFLOXACIN IN D5W 400 MG/200ML IV SOLN
400.0000 mg | Freq: Once | INTRAVENOUS | Status: AC
  Administered 2013-06-21: 400 mg via INTRAVENOUS

## 2013-06-21 MED ORDER — GLYCOPYRROLATE 0.2 MG/ML IJ SOLN
INTRAMUSCULAR | Status: AC
  Filled 2013-06-21: qty 2

## 2013-06-21 MED ORDER — LACTATED RINGERS IV SOLN
INTRAVENOUS | Status: DC
  Administered 2013-06-21: 15:00:00 via INTRAVENOUS
  Administered 2013-06-21: 1000 mL via INTRAVENOUS

## 2013-06-21 MED ORDER — SODIUM CHLORIDE 0.9 % IR SOLN
Status: DC | PRN
  Administered 2013-06-21: 3000 mL

## 2013-06-21 MED ORDER — POTASSIUM CHLORIDE IN NACL 20-0.9 MEQ/L-% IV SOLN
INTRAVENOUS | Status: DC
  Administered 2013-06-21 – 2013-06-22 (×2): via INTRAVENOUS

## 2013-06-21 SURGICAL SUPPLY — 66 items
APPLICATOR COTTON TIP 6IN STRL (MISCELLANEOUS) ×2 IMPLANT
APPLIER CLIP LAPSCP 10X32 DD (CLIP) ×2 IMPLANT
ATTRACTOMAT 16X20 MAGNETIC DRP (DRAPES) IMPLANT
BAG HAMPER (MISCELLANEOUS) ×2 IMPLANT
BLADE SURG 15 STRL LF DISP TIS (BLADE) ×1 IMPLANT
BLADE SURG 15 STRL SS (BLADE) ×1
BLADE SURG SZ10 CARB STEEL (BLADE) IMPLANT
CLOTH BEACON ORANGE TIMEOUT ST (SAFETY) ×2 IMPLANT
COVER LIGHT HANDLE STERIS (MISCELLANEOUS) ×4 IMPLANT
DECANTER SPIKE VIAL GLASS SM (MISCELLANEOUS) ×2 IMPLANT
DISSECTOR BLUNT TIP ENDO 5MM (MISCELLANEOUS) ×2 IMPLANT
DRAPE WARM FLUID 44X44 (DRAPE) IMPLANT
DRSG TEGADERM 2-3/8X2-3/4 SM (GAUZE/BANDAGES/DRESSINGS) ×6 IMPLANT
ELECT BLADE 6 FLAT ULTRCLN (ELECTRODE) IMPLANT
ELECT REM PT RETURN 9FT ADLT (ELECTROSURGICAL) ×2
ELECTRODE REM PT RTRN 9FT ADLT (ELECTROSURGICAL) ×1 IMPLANT
EVACUATOR DRAINAGE 10X20 100CC (DRAIN) ×1 IMPLANT
EVACUATOR SILICONE 100CC (DRAIN) ×1
FILTER SMOKE EVAC LAPAROSHD (FILTER) ×2 IMPLANT
FORMALIN 10 PREFIL 120ML (MISCELLANEOUS) ×2 IMPLANT
GLOVE BIOGEL PI IND STRL 7.0 (GLOVE) ×1 IMPLANT
GLOVE BIOGEL PI IND STRL 7.5 (GLOVE) ×1 IMPLANT
GLOVE BIOGEL PI INDICATOR 7.0 (GLOVE) ×1
GLOVE BIOGEL PI INDICATOR 7.5 (GLOVE) ×1
GLOVE EXAM NITRILE PF MED BLUE (GLOVE) ×2 IMPLANT
GLOVE SKINSENSE NS SZ7.0 (GLOVE) ×3
GLOVE SKINSENSE STRL SZ7.0 (GLOVE) ×3 IMPLANT
GOWN STRL REIN XL XLG (GOWN DISPOSABLE) ×6 IMPLANT
HEMOSTAT SURGICEL 4X8 (HEMOSTASIS) ×2 IMPLANT
INST SET LAPROSCOPIC AP (KITS) ×2 IMPLANT
IV NS IRRIG 3000ML ARTHROMATIC (IV SOLUTION) ×2 IMPLANT
KIT ROOM TURNOVER APOR (KITS) ×2 IMPLANT
MANIFOLD NEPTUNE II (INSTRUMENTS) ×2 IMPLANT
NS IRRIG 1000ML POUR BTL (IV SOLUTION) ×2 IMPLANT
PACK LAP CHOLE LZT030E (CUSTOM PROCEDURE TRAY) ×2 IMPLANT
PAD ARMBOARD 7.5X6 YLW CONV (MISCELLANEOUS) ×2 IMPLANT
PENCIL HANDSWITCHING (ELECTRODE) IMPLANT
POUCH SPECIMEN RETRIEVAL 10MM (ENDOMECHANICALS) ×2 IMPLANT
SET BASIN LINEN APH (SET/KITS/TRAYS/PACK) ×2 IMPLANT
SET TUBE IRRIG SUCTION NO TIP (IRRIGATION / IRRIGATOR) ×2 IMPLANT
SOL PREP PROV IODINE SCRUB 4OZ (MISCELLANEOUS) ×2 IMPLANT
SPONGE DRAIN TRACH 4X4 STRL 2S (GAUZE/BANDAGES/DRESSINGS) ×2 IMPLANT
SPONGE GAUZE 4X4 12PLY (GAUZE/BANDAGES/DRESSINGS) ×2 IMPLANT
SPONGE INTESTINAL PEANUT (DISPOSABLE) IMPLANT
SPONGE LAP 18X18 X RAY DECT (DISPOSABLE) IMPLANT
STAPLER VISISTAT 35W (STAPLE) ×2 IMPLANT
SUT ETHILON 3 0 FSL (SUTURE) ×4 IMPLANT
SUT SILK 2 0 (SUTURE)
SUT SILK 2 0 SH (SUTURE) IMPLANT
SUT SILK 2-0 18XBRD TIE 12 (SUTURE) IMPLANT
SUT SILK 3 0 SH CR/8 (SUTURE) IMPLANT
SUT VIC AB 0 CT1 27 (SUTURE)
SUT VIC AB 0 CT1 27XBRD ANTBC (SUTURE) IMPLANT
SUT VIC AB 0 CT1 27XCR 8 STRN (SUTURE) IMPLANT
SUT VICRYL 0 UR6 27IN ABS (SUTURE) ×2 IMPLANT
SYR BULB IRRIGATION 50ML (SYRINGE) IMPLANT
TAPE CLOTH SURG 4X10 WHT LF (GAUZE/BANDAGES/DRESSINGS) ×2 IMPLANT
TOWEL OR 17X26 4PK STRL BLUE (TOWEL DISPOSABLE) ×2 IMPLANT
TRAY FOLEY CATH 16FR SILVER (SET/KITS/TRAYS/PACK) IMPLANT
TROCAR ENDO BLADELESS 11MM (ENDOMECHANICALS) ×2 IMPLANT
TROCAR XCEL NON-BLD 5MMX100MML (ENDOMECHANICALS) ×2 IMPLANT
TUBING INSUF HEATED (TUBING) IMPLANT
TUBING INSUFFLATION (TUBING) ×2 IMPLANT
WARMER LAPAROSCOPE (MISCELLANEOUS) ×2 IMPLANT
WATER STERILE IRR 1000ML POUR (IV SOLUTION) ×4 IMPLANT
YANKAUER SUCT BULB TIP 10FT TU (MISCELLANEOUS) IMPLANT

## 2013-06-21 NOTE — Progress Notes (Signed)
Admit 32 yr. Old W female for lap cholecystectomy after ERCP for choledocholithiasis.  Procedure and risks explained and informed consent obtained.  H&P done with consult.  Pt clinically much improved with no RUQ pain at present following her CBD stone removal.  Filed Vitals:   06/21/13 1300  BP: 119/49  Pulse:   Temp:   Resp: 16  pulse 86/min,temp 98.0, O2 sat 98%

## 2013-06-21 NOTE — Anesthesia Procedure Notes (Signed)
Procedure Name: Intubation Date/Time: 06/21/2013 1:57 PM Performed by: Franco Nones Pre-anesthesia Checklist: Patient identified, Patient being monitored, Timeout performed, Emergency Drugs available and Suction available Patient Re-evaluated:Patient Re-evaluated prior to inductionOxygen Delivery Method: Circle System Utilized Preoxygenation: Pre-oxygenation with 100% oxygen Intubation Type: IV induction, Rapid sequence and Cricoid Pressure applied Ventilation: Mask ventilation without difficulty Laryngoscope Size: Miller and 2 Grade View: Grade I Tube type: Oral Tube size: 7.0 mm Number of attempts: 1 Airway Equipment and Method: stylet Placement Confirmation: ETT inserted through vocal cords under direct vision,  positive ETCO2 and breath sounds checked- equal and bilateral Secured at: 21 cm Tube secured with: Tape Dental Injury: Teeth and Oropharynx as per pre-operative assessment

## 2013-06-21 NOTE — Brief Op Note (Signed)
06/21/2013  3:44 PM  PATIENT:  Catherine Bishop  32 y.o. female  PRE-OPERATIVE DIAGNOSIS:  cholelithiasis  POST-OPERATIVE DIAGNOSIS:  cholelithiasis, cholecystitis, status post ERCP  PROCEDURE:  Procedure(s): LAPAROSCOPIC CHOLECYSTECTOMY (N/A)  SURGEON:  Surgeon(s) and Role:    * Marlane Hatcher, MD - Primary  PHYSICIAN ASSISTANT:   ASSISTANTS: none   ANESTHESIA:   general  EBL:  Total I/O In: 1200 [I.V.:1200] Out: 150 [Urine:25; Other:100; Blood:25]  BLOOD ADMINISTERED:none  DRAINS: Penrose drain in the placed im n the liver bed.   LOCAL MEDICATIONS USED:  MARCAINE   0.5%  ~ 13 cc.  SPECIMEN:  Source of Specimen:  gall baldder and stones.  DISPOSITION OF SPECIMEN:  PATHOLOGY  COUNTS:  YES  TOURNIQUET:  * No tourniquets in log *  DICTATION: .Other Dictation: Dictation Number OR dict. #  [id number].  PLAN OF CARE: Admit for overnight observation  PATIENT DISPOSITION:  PACU - hemodynamically stable.   Delay start of Pharmacological VTE agent (>24hrs) due to surgical blood loss or risk of bleeding: not applicable

## 2013-06-21 NOTE — Anesthesia Preprocedure Evaluation (Signed)
Anesthesia Evaluation  Patient identified by MRN, date of birth, ID band Patient awake    Reviewed: Allergy & Precautions, H&P , Patient's Chart, lab work & pertinent test results  Airway Mallampati: III TM Distance: >3 FB Neck ROM: full    Dental no notable dental hx.    Pulmonary neg pulmonary ROS,  breath sounds clear to auscultation  Pulmonary exam normal       Cardiovascular hypertension, negative cardio ROS  Rhythm:regular Rate:Normal     Neuro/Psych Seizures -,  PSYCHIATRIC DISORDERS Anxiety negative neurological ROS  negative psych ROS   GI/Hepatic negative GI ROS, Neg liver ROS,   Endo/Other  negative endocrine ROSdiabetesMorbid obesity  Renal/GU negative Renal ROS     Musculoskeletal   Abdominal   Peds  Hematology negative hematology ROS (+)   Anesthesia Other Findings  Pelvic pain in female     Left ovarian cyst        Irregular bleeding     Tilted uterus        GDM (gestational diabetes mellitus)     Hypertension        Epilepsy   childhood Gestational diabetes   diet controlled    Anxiety    Reproductive/Obstetrics                           Anesthesia Physical Anesthesia Plan  ASA: III  Anesthesia Plan: General   Post-op Pain Management:    Induction: Intravenous, Rapid sequence and Cricoid pressure planned  Airway Management Planned: Oral ETT  Additional Equipment:   Intra-op Plan:   Post-operative Plan: Extubation in OR  Informed Consent: I have reviewed the patients History and Physical, chart, labs and discussed the procedure including the risks, benefits and alternatives for the proposed anesthesia with the patient or authorized representative who has indicated his/her understanding and acceptance.     Plan Discussed with:   Anesthesia Plan Comments:         Anesthesia Quick Evaluation

## 2013-06-21 NOTE — Transfer of Care (Signed)
Immediate Anesthesia Transfer of Care Note  Patient: Catherine Bishop  Procedure(s) Performed: Procedure(s): LAPAROSCOPIC CHOLECYSTECTOMY (N/A)  Patient Location: PACU  Anesthesia Type:General  Level of Consciousness: awake and patient cooperative  Airway & Oxygen Therapy: Patient Spontanous Breathing and non-rebreather face mask  Post-op Assessment: Report given to PACU RN, Post -op Vital signs reviewed and stable and Patient moving all extremities nausea present  Post vital signs: Reviewed and stable  Complications: No apparent anesthesia complications

## 2013-06-21 NOTE — Addendum Note (Signed)
Addendum created 06/21/13 1319 by Franco Nones, CRNA   Modules edited: Anesthesia Events

## 2013-06-21 NOTE — Anesthesia Postprocedure Evaluation (Signed)
  Anesthesia Post-op Note  Patient: Catherine Bishop  Procedure(s) Performed: Procedure(s): LAPAROSCOPIC CHOLECYSTECTOMY (N/A)  Patient Location: nursing unit  Anesthesia Type:General  Level of Consciousness: awake and patient cooperative  Airway and Oxygen Therapy: Patient Spontanous Breathing  Post-op Pain: mild  Post-op Assessment: Post-op Vital signs reviewed, Patient's Cardiovascular Status Stable, Respiratory Function Stable and Patent Airway  Moderate nausea  Post-op Vital Signs: Reviewed and stable  Complications: No apparent anesthesia complications

## 2013-06-21 NOTE — Progress Notes (Signed)
Post OP Check  Pt awake and alert.  Considerable post op nausea from anesthesia as occurred after her ERCP.  Dressings are clean and dry.  Minimal serous JP drainage. Pt will be moved from PACU to floor soon. Filed Vitals:   06/21/13 1630  BP:   Pulse: 71  Temp:   Resp: 14  bp 145/80   HR 66/min.

## 2013-06-22 LAB — CBC
HCT: 36 % (ref 36.0–46.0)
MCHC: 32.8 g/dL (ref 30.0–36.0)
MCV: 86.1 fL (ref 78.0–100.0)
Platelets: 267 10*3/uL (ref 150–400)
RBC: 4.18 MIL/uL (ref 3.87–5.11)
WBC: 7.8 10*3/uL (ref 4.0–10.5)

## 2013-06-22 LAB — HEPATIC FUNCTION PANEL
ALT: 160 U/L — ABNORMAL HIGH (ref 0–35)
Bilirubin, Direct: 0.4 mg/dL — ABNORMAL HIGH (ref 0.0–0.3)
Indirect Bilirubin: 0.6 mg/dL (ref 0.3–0.9)

## 2013-06-22 LAB — BASIC METABOLIC PANEL
CO2: 23 mEq/L (ref 19–32)
Chloride: 103 mEq/L (ref 96–112)
Creatinine, Ser: 0.68 mg/dL (ref 0.50–1.10)
Potassium: 3.6 mEq/L (ref 3.5–5.1)
Sodium: 137 mEq/L (ref 135–145)

## 2013-06-22 MED ORDER — DOCUSATE SODIUM 100 MG PO CAPS
100.0000 mg | ORAL_CAPSULE | Freq: Every day | ORAL | Status: DC
  Administered 2013-06-22: 100 mg via ORAL
  Filled 2013-06-22: qty 1

## 2013-06-22 MED ORDER — OXYCODONE-ACETAMINOPHEN 5-325 MG PO TABS: 1.0000 | ORAL_TABLET | ORAL | Status: DC | PRN

## 2013-06-22 MED ORDER — DSS 100 MG PO CAPS: 100.0000 mg | ORAL_CAPSULE | Freq: Every day | ORAL | Status: DC

## 2013-06-22 NOTE — Op Note (Signed)
Catherine Bishop, BURKEL              ACCOUNT NO.:  [id number]  MEDICAL RECORD NO.:  [id number]  LOCATION:  A305                          FACILITY:  [facility]  PHYSICIAN:  Barbaraann Barthel, M.D. DATE OF BIRTH:  01/10/1981  DATE OF PROCEDURE:  06/21/2013 DATE OF DISCHARGE:                              OPERATIVE REPORT   DIAGNOSES:  Cholecystitis, cholelithiasis status post endoscopic retrograde cholangiopancreatography with common bile duct extraction and sphincterotomy by Lionel December, MD  SPECIMEN:  Gallbladder and stones.  GROSS OPERATIVE FINDINGS:  The patient had dilated cystic duct with at least 4 marble sized stones within the gallbladder.  Rest of the upper quadrant appeared to be grossly within normal limits.  Liver that showed signs of fatty infiltration, but otherwise grossly within normal limits laparoscopically.  WOUND CLASSIFICATION:  Clean contaminated.  TECHNIQUE:  The patient was placed in supine position.  After the adequate administration of general anesthesia via endotracheal intubation, her entire abdomen was prepped with Betadine solution and draped in usual manner.  Prior to this, a Foley catheter was aseptically inserted.  A periumbilical incision was carried out over the superior aspect of the umbilicus through skin, subcutaneous tissue, down to the fascia.  The fascia was grasped with a sharp towel clip and a Veress needle was inserted and confirmed of position with a saline drop test. We had to reposition this because of the patient's rather obese abdomen, but there were no problems.  We insufflated the abdomen with about 3.5 liters of CO2 and then using a Visiport technique, an 11-mm cannula was placed in the umbilicus incision and another 11-mm cannula was placed in the epigastrium with two 5-mm cannulas placed in the right upper quadrant laterally.  The gallbladder was grasped.  Its adhesions were taken down.  The cystic duct was clearly dissected free  from the gallbladder as well as the cystic artery which was a pretty good size. The artery was triply silver clipped and divided and the cystic duct because it was dilated, we had to use an Endo-GIA II to staple across this.  We then removed the gallbladder from the liver bed using the hook cautery device.  There was minimal oozing from this because the patient had a pretty substantial mesentery with the gallbladder.  We then irrigated with normal saline solution.  I elected to leave Surgicel in the liver bed as well as a Jackson-Pratt drain which exited through one of the lateral cannula sites.  We then closed the fascia with 0 Polysorb suture in the area of the umbilicus and in the area of the epigastrium, and the wounds were irrigated and I used a 0.5% Sensorcaine approximately 13 mL for postoperative comfort.  The wounds were all closed with a stapling device.  The drain was sutured place with 3-0 nylon and prior to closure, all sponge, needle, and instruments were found to be correct.  Estimated blood loss was less than 25 mL.  The patient received 1200 mL of crystalloids intraoperatively.  There were no complications.  The patient will be admitted for observation and I will follow the patient perioperatively after which time she is returned to her family practice physician.  Barbaraann Barthel, M.D.     WB/MEDQ  D:  06/21/2013  T:  06/22/2013  Job:  [id number]  cc:   Lionel December, M.D. Fax: [phone]  Dr. Thea Silversmith

## 2013-06-22 NOTE — Discharge Summary (Signed)
Catherine Bishop, Catherine Bishop              ACCOUNT NO.:  [id number]  MEDICAL RECORD NO.:  [id number]  LOCATION:  A305                          FACILITY:  [facility]  PHYSICIAN:  Barbaraann Barthel, M.D. DATE OF BIRTH:  Jun 13, 1981  DATE OF ADMISSION:  06/21/2013 DATE OF DISCHARGE:  10/23/2014LH                              DISCHARGE SUMMARY   DIAGNOSES:  Cholecystitis, cholelithiasis, status post ERCP for choledocholithiasis.  Note this is a 32 year old obese white female, who had an episode of choledocholithiasis and cholecystitis.  She was treated first by ERCP as an outpatient by Dr. Karilyn Cota.  This went well and the patient was then admitted via the outpatient department for an interval cholecystectomy. This was performed on June 21, 2013 laparoscopically uneventfully. The patient was found to have cholelithiasis at that time and otherwise besides fatty infiltration of the liver, no other abnormalities were noted laparoscopically.  Postoperatively, she did well and on the first postoperative day, she had scanty serous JP drainage.  Her bilirubin and liver function studies continued their downward trend, and she was tolerating p.o. well without leg pain, shortness of breath, or dysuria. We will follow up with her as an outpatient after which she is to return to her private care physician, who has been identified as Dr. Ronne Binning. We will follow her perioperatively until then.     Barbaraann Barthel, M.D.     WB/MEDQ  D:  06/22/2013  T:  06/22/2013  Job:  [id number]  cc:   Dr. Ronne Binning

## 2013-06-22 NOTE — Progress Notes (Signed)
UR chart review completed.  

## 2013-06-22 NOTE — Anesthesia Postprocedure Evaluation (Signed)
  Anesthesia Post-op Note  Patient: Catherine Bishop  Procedure(s) Performed: Procedure(s): LAPAROSCOPIC CHOLECYSTECTOMY (N/A)  Patient Location: Nursing Unit  Anesthesia Type:General  Level of Consciousness: awake  Airway and Oxygen Therapy: Patient Spontanous Breathing  Post-op Pain: mild  Post-op Assessment: Post-op Vital signs reviewed, Patient's Cardiovascular Status Stable, Respiratory Function Stable, Patent Airway, No signs of Nausea or vomiting, Adequate PO intake and Pain level controlled  Post-op Vital Signs: Reviewed and stable  Complications: No apparent anesthesia complications

## 2013-06-22 NOTE — Progress Notes (Signed)
POD #1  Filed Vitals:   06/22/13 0440  BP: 106/59  Pulse: 80  Temp: 98.1 F (36.7 C)  Resp: 18    Some incisional tenderness as expected but abdomen is very soft with normo active bowel sound.  Pt has tolerated PO with  Nausea and  Feels "much better."  Wound is clean and was redressed.  I removed JP drain that had scant amt. Of serous drainage.  Labs show continued downward trend of bili and liver enzymes.  Doing well, discharge and follow up arranged. Discharge note dictated, # [id number].

## 2013-06-23 ENCOUNTER — Encounter (HOSPITAL_COMMUNITY): Payer: Self-pay | Admitting: General Surgery

## 2013-10-08 ENCOUNTER — Other Ambulatory Visit: Payer: Self-pay | Admitting: Adult Health

## 2013-11-04 ENCOUNTER — Other Ambulatory Visit: Payer: Self-pay | Admitting: Adult Health

## 2013-12-03 ENCOUNTER — Other Ambulatory Visit: Payer: Self-pay | Admitting: Adult Health

## 2014-01-27 ENCOUNTER — Other Ambulatory Visit: Payer: Self-pay | Admitting: Adult Health

## 2014-02-24 ENCOUNTER — Other Ambulatory Visit: Payer: Self-pay | Admitting: Adult Health

## 2014-02-28 ENCOUNTER — Other Ambulatory Visit: Payer: Self-pay | Admitting: Advanced Practice Midwife

## 2014-03-08 ENCOUNTER — Encounter: Payer: Self-pay | Admitting: Advanced Practice Midwife

## 2014-03-08 ENCOUNTER — Ambulatory Visit (INDEPENDENT_AMBULATORY_CARE_PROVIDER_SITE_OTHER): Payer: BC Managed Care – PPO | Admitting: Advanced Practice Midwife

## 2014-03-08 ENCOUNTER — Other Ambulatory Visit (HOSPITAL_COMMUNITY)
Admission: RE | Admit: 2014-03-08 | Discharge: 2014-03-08 | Disposition: A | Discharge status: Home / Self Care | Payer: BC Managed Care – PPO | Source: Ambulatory Visit | Attending: Advanced Practice Midwife | Admitting: Advanced Practice Midwife

## 2014-03-08 VITALS — BP 102/80 | Ht 65.0 in | Wt 233.5 lb

## 2014-03-08 DIAGNOSIS — Z01419 Encounter for gynecological examination (general) (routine) without abnormal findings: Secondary | ICD-10-CM

## 2014-03-08 DIAGNOSIS — Z1151 Encounter for screening for human papillomavirus (HPV): Secondary | ICD-10-CM | POA: Insufficient documentation

## 2014-03-08 DIAGNOSIS — Z0000 Encounter for general adult medical examination without abnormal findings: Secondary | ICD-10-CM | POA: Insufficient documentation

## 2014-03-08 MED ORDER — NORGESTIMATE-ETH ESTRADIOL 0.25-35 MG-MCG PO TABS: 1.0000 | ORAL_TABLET | Freq: Every day | ORAL | Status: DC

## 2014-03-08 NOTE — Patient Instructions (Signed)
CBC, Comprehensive metabolic panel, TSH, Lipid profile (cholesterol, HDL, etc),

## 2014-03-08 NOTE — Progress Notes (Signed)
Catherine Bishop 33 y.o.  Filed Vitals:   03/08/14 1402  BP: 102/80     Past Medical History: Past Medical History  Diagnosis Date  . Pelvic pain in female   . Left ovarian cyst   . Irregular bleeding   . Tilted uterus   . GDM (gestational diabetes mellitus)   . Epilepsy     childhood  . Gestational diabetes     diet controlled  . Anxiety   . Hypertension     Past Surgical History: Past Surgical History  Procedure Laterality Date  . Wisdom tooth extraction    . Ercp N/A 06/16/2013    Procedure: ENDOSCOPIC RETROGRADE CHOLANGIOPANCREATOGRAPHY (ERCP);  Surgeon: Malissa HippoNajeeb U Rehman, MD;  Location: [facility];  Service: Endoscopy;  Laterality: N/A;  . Sphincterotomy N/A 06/16/2013    Procedure: SPHINCTEROTOMY, STONE EXTRACTION;  Surgeon: Malissa HippoNajeeb U Rehman, MD;  Location: [facility];  Service: Endoscopy;  Laterality: N/A;  . Cholecystectomy N/A 06/21/2013    Procedure: LAPAROSCOPIC CHOLECYSTECTOMY;  Surgeon: Marlane HatcherWilliam S Bradford, MD;  Location: [facility];  Service: General;  Laterality: N/A;    Family History: Family History  Problem Relation Age of Onset  . Cancer Maternal Grandmother     renal  . Diabetes Maternal Grandmother   . Heart disease Maternal Grandfather   . Heart attack Maternal Grandfather     Social History: History  Substance Use Topics  . Smoking status: Former Smoker -- 0.50 packs/day    Types: Cigarettes    Quit date: 06/28/2008  . Smokeless tobacco: Never Used  . Alcohol Use: Yes     Comment: occ    Allergies:  Allergies  Allergen Reactions  . Latex Rash  . Penicillins Rash     No current outpatient prescriptions on file.  History of Present Illness: Here for well woman exam.  Still on POP, has lots of BTB.  Not breastfeeding anymore.  Review of Systems   Patient denies any headaches, blurred vision, shortness of breath, chest pain, abdominal pain, problems with bowel movements, urination.  Has some spotting after intercourse.   Physical  Exam: General:  Well developed, well nourished, no acute distress Skin:  Warm and dry Neck:  Midline trachea, normal thyroid Lungs; Clear to auscultation bilaterally Breast:  No dominant palpable mass, retraction, or nipple discharge Cardiovascular: Regular rate and rhythm Abdomen:  Soft, non tender, no hepatosplenomegaly Pelvic:  External genitalia is normal in appearance.  The vagina is normal in appearance.  The cervix is bulbous and friable. Normal appearing discharge. Uterus is felt to be normal size, shape, and contour.  No adnexal masses or tenderness noted.  Exam llimited by body habitus Rectal: Good sphincter tone, no polyps, or hemorrhoids felt.  Does have some extra skin where a thrombosed hemorrhoid was evacuated during pregnancy.  Pt does not like it. Extremities:  No swelling or varicosities noted Psych:  No mood changes   Impression:  Normal well woman exam   Plan: PAP with HPV cotesting Clindesse X 1 for cervicitis Change COC's to Sprintec CBC, CMP, Lipids, TSH when fasting

## 2014-03-12 LAB — CYTOLOGY - PAP

## 2014-03-13 ENCOUNTER — Telehealth: Payer: Self-pay | Admitting: Advanced Practice Midwife

## 2014-03-13 NOTE — Telephone Encounter (Signed)
Pt have labs re-faxed today, will call pt once receive results and discuss with Rodena PietyFran Cresenzo, CNM.

## 2014-03-15 ENCOUNTER — Other Ambulatory Visit: Payer: BC Managed Care – PPO

## 2014-03-15 DIAGNOSIS — Z13220 Encounter for screening for lipoid disorders: Secondary | ICD-10-CM

## 2014-03-15 DIAGNOSIS — Z0000 Encounter for general adult medical examination without abnormal findings: Secondary | ICD-10-CM

## 2014-03-15 DIAGNOSIS — Z1329 Encounter for screening for other suspected endocrine disorder: Secondary | ICD-10-CM

## 2014-03-15 LAB — CBC
HEMATOCRIT: 37.5 % (ref 36.0–46.0)
Hemoglobin: 13 g/dL (ref 12.0–15.0)
MCH: 28.4 pg (ref 26.0–34.0)
MCHC: 34.7 g/dL (ref 30.0–36.0)
MCV: 81.9 fL (ref 78.0–100.0)
Platelets: 291 10*3/uL (ref 150–400)
RBC: 4.58 MIL/uL (ref 3.87–5.11)
RDW: 13.9 % (ref 11.5–15.5)
WBC: 7.7 10*3/uL (ref 4.0–10.5)

## 2014-03-15 LAB — COMPREHENSIVE METABOLIC PANEL
ALBUMIN: 3.6 g/dL (ref 3.5–5.2)
ALT: 12 U/L (ref 0–35)
AST: 13 U/L (ref 0–37)
Alkaline Phosphatase: 79 U/L (ref 39–117)
BUN: 9 mg/dL (ref 6–23)
CO2: 25 mEq/L (ref 19–32)
Calcium: 9.3 mg/dL (ref 8.4–10.5)
Chloride: 103 mEq/L (ref 96–112)
Creat: 0.75 mg/dL (ref 0.50–1.10)
GLUCOSE: 89 mg/dL (ref 70–99)
POTASSIUM: 4.2 meq/L (ref 3.5–5.3)
SODIUM: 138 meq/L (ref 135–145)
TOTAL PROTEIN: 6.6 g/dL (ref 6.0–8.3)
Total Bilirubin: 0.4 mg/dL (ref 0.2–1.2)

## 2014-03-15 LAB — LIPID PANEL
CHOLESTEROL: 169 mg/dL (ref 0–200)
HDL: 51 mg/dL (ref >39)
LDL Cholesterol: 96 mg/dL (ref 0–99)
TRIGLYCERIDES: 112 mg/dL (ref <150)
Total CHOL/HDL Ratio: 3.3 Ratio
VLDL: 22 mg/dL (ref 0–40)

## 2014-03-16 LAB — TSH: TSH: 1.313 u[IU]/mL (ref 0.350–4.500)

## 2014-04-27 ENCOUNTER — Telehealth: Payer: Self-pay | Admitting: Advanced Practice Midwife

## 2014-04-27 NOTE — Telephone Encounter (Signed)
Pt states sprintec "not working" having 2 periods per month. Pt states Drenda Freeze stated if Sprintec did not help with period management let her know. Pt aware Drenda Freeze out of office till next week.

## 2014-04-29 NOTE — Telephone Encounter (Signed)
Just starting 3rd month of Sprintec (changing from POP's). Still having BTB.  Needs to give it one more pack before making any changes, as this is a common side effect during the first 3 months of COC's.

## 2014-04-30 NOTE — Telephone Encounter (Signed)
Pt informed BTB common needs to continue taking the birth control the same time everyday, if continues to have BTB in the 4th pack of birth control pills to call our office back. Pt verbalized understanding.

## 2014-07-02 ENCOUNTER — Encounter: Payer: Self-pay | Admitting: Advanced Practice Midwife

## 2014-11-24 IMAGING — RF DG ERCP WO/W SPHINCTEROTOMY
1 series · 13 of 13 positions shown · non-contrast
Comparison: Ultrasound 06/11/2013.

CLINICAL DATA: Common bile duct stone.

EXAM:
ERCP
TECHNIQUE: Multiple spot images obtained with the fluoroscopic device and
submitted for interpretation post-procedure.

[Series 1: run · 4 acquisitions, 13 frames shown]
[im 1/4]
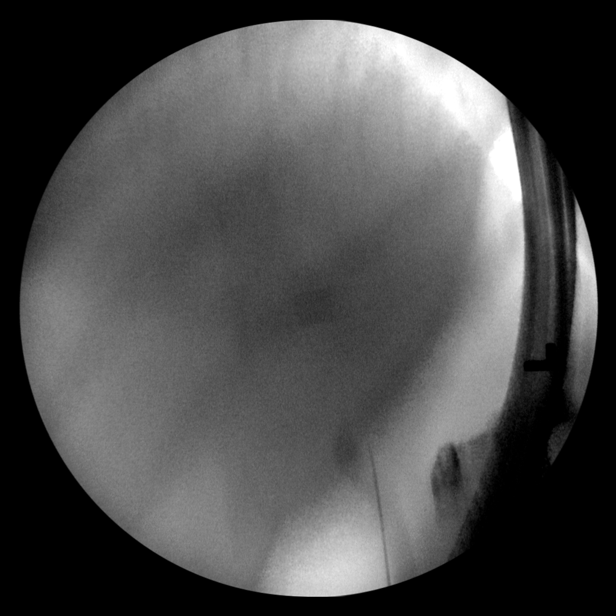
[im 1/4]
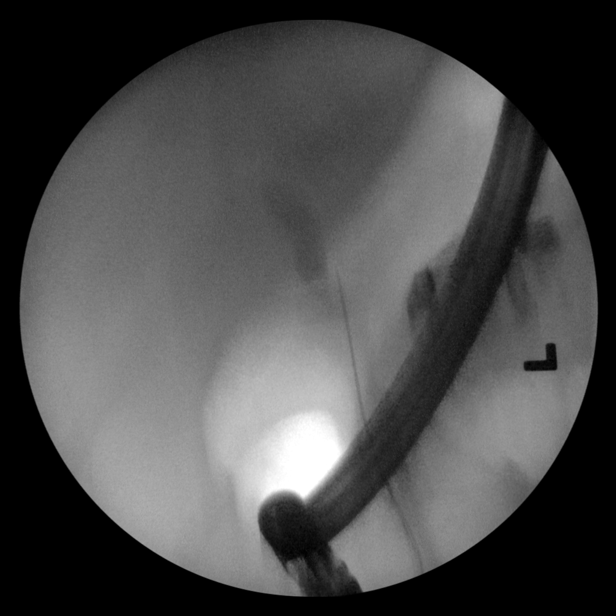
[im 1/4]
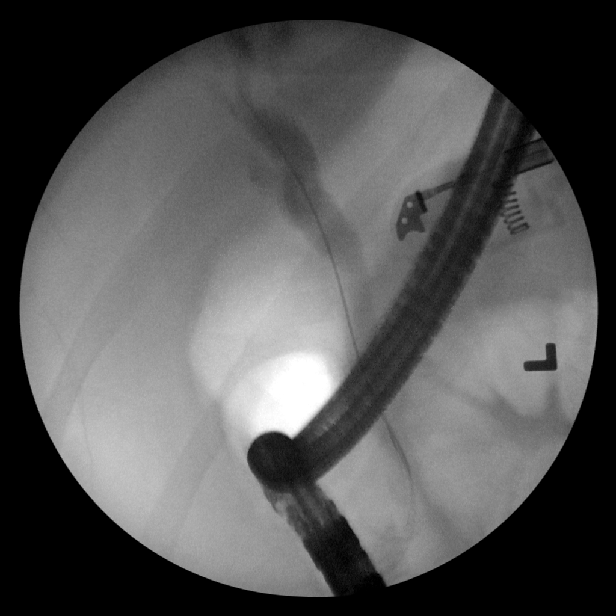
[im 1/4]
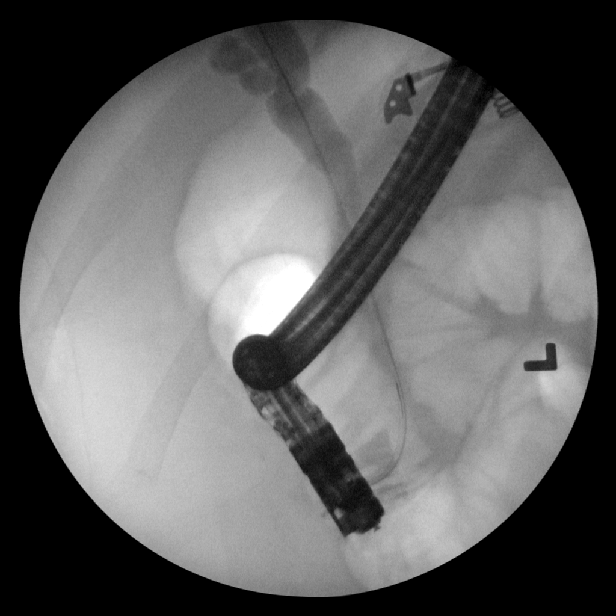
[im 2/4]
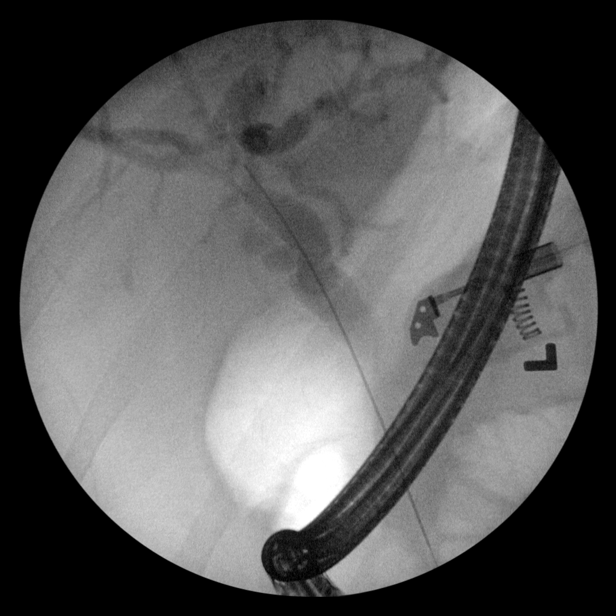
[im 3/4]
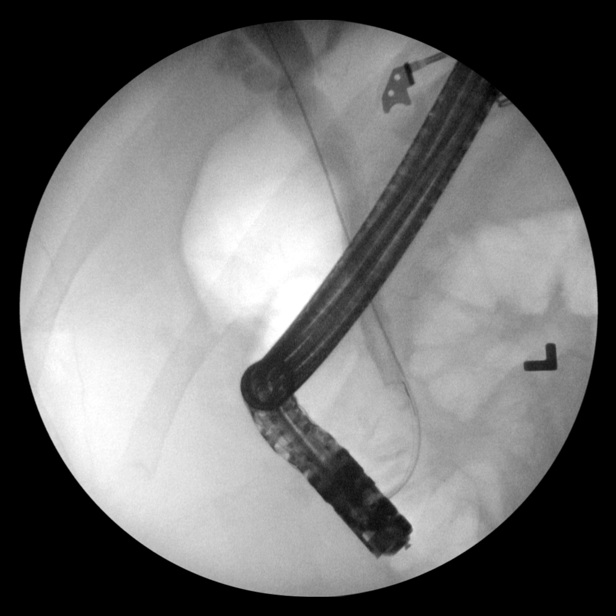
[im 3/4]
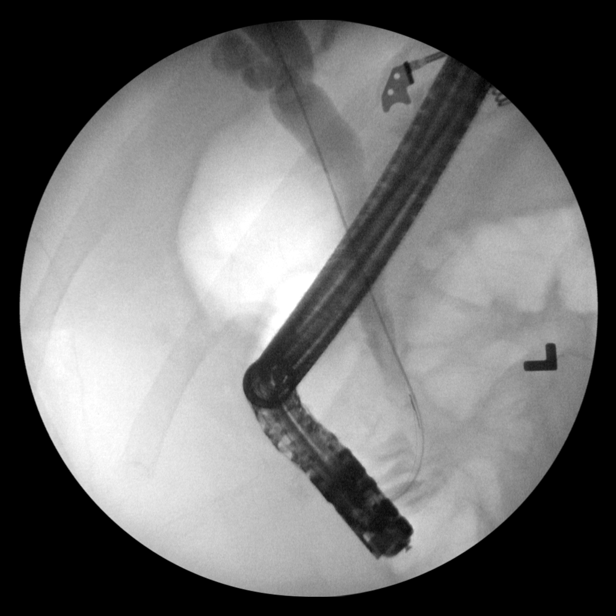
[im 3/4]
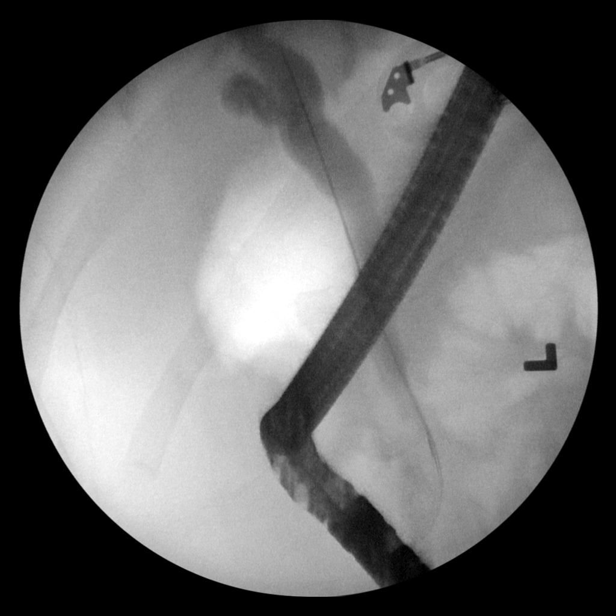
[im 3/4]
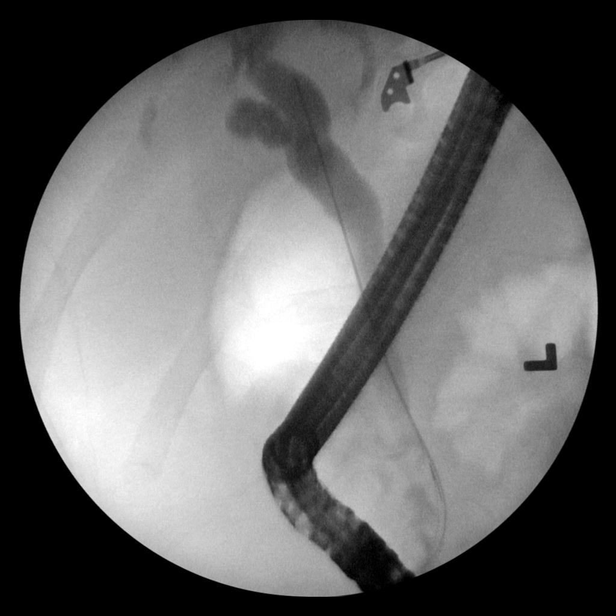
[im 4/4]
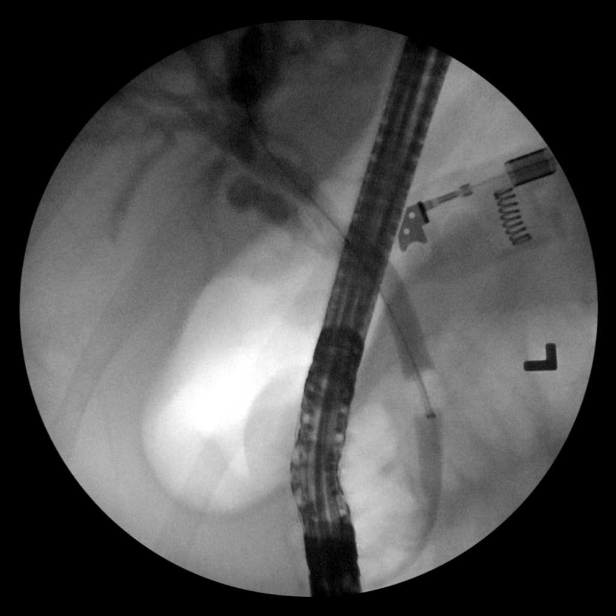
[im 4/4]
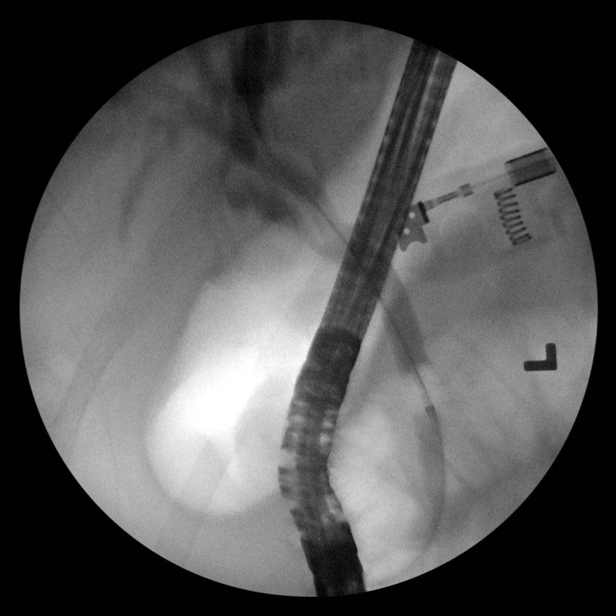
[im 4/4]
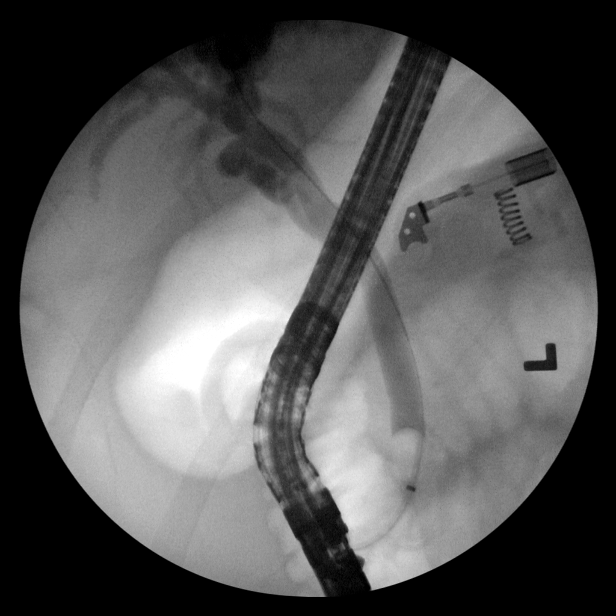
[im 4/4]
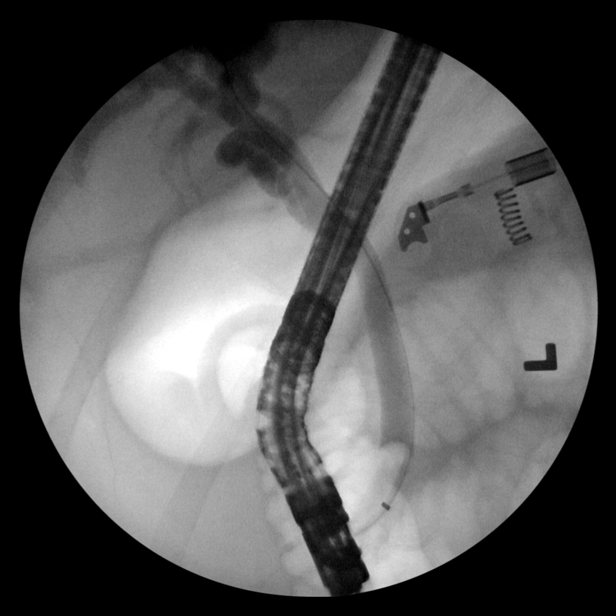

[13 of 13 positions shown; findings below may reference images not displayed]

FINDINGS: Cine fluoroscopic sequences from the examination demonstrate
cannulation of a dilated common bile duct with injection of contrast
material demonstrating intrahepatic biliary dilatation. On the 2nd
sequence there appears to be and irregular filling defect in the
distal common bile duct consistent with a distal common bile duct
stone.

Subsequent sphincterotomy and balloon passage. No obvious persistent
filling defect on the last sequence.
IMPRESSION: Sphincterotomy, balloon passage and common bile duct stone
extraction.

These images were submitted for radiologic interpretation only.
Please see the procedural report for the amount of contrast and the
fluoroscopy time utilized.

## 2015-02-08 ENCOUNTER — Other Ambulatory Visit: Payer: Self-pay | Admitting: *Deleted

## 2015-02-08 MED ORDER — NORGESTIMATE-ETH ESTRADIOL 0.25-35 MG-MCG PO TABS: 1.0000 | ORAL_TABLET | Freq: Every day | ORAL | Status: DC

## 2015-03-12 ENCOUNTER — Encounter: Payer: Self-pay | Admitting: Advanced Practice Midwife

## 2015-03-12 ENCOUNTER — Ambulatory Visit (INDEPENDENT_AMBULATORY_CARE_PROVIDER_SITE_OTHER): Payer: BLUE CROSS/BLUE SHIELD | Admitting: Advanced Practice Midwife

## 2015-03-12 VITALS — BP 100/60 | Ht 65.0 in | Wt 232.5 lb

## 2015-03-12 DIAGNOSIS — Z01419 Encounter for gynecological examination (general) (routine) without abnormal findings: Secondary | ICD-10-CM | POA: Diagnosis not present

## 2015-03-12 MED ORDER — NORETHIN ACE-ETH ESTRAD-FE 1-20 MG-MCG(24) PO TABS: 1.0000 | ORAL_TABLET | Freq: Every day | ORAL | Status: DC

## 2015-03-12 NOTE — Progress Notes (Signed)
Catherine Bishop 34 y.o.  Filed Vitals:   03/12/15 1038  BP: 100/60     Past Medical History: Past Medical History  Diagnosis Date  . Pelvic pain in female   . Left ovarian cyst   . Irregular bleeding   . Tilted uterus   . GDM (gestational diabetes mellitus)   . Gestational diabetes     diet controlled  . Anxiety   . Epilepsy     childhood last seizure 34yo, no meds  . Hypertension     pregnancy only    Past Surgical History: Past Surgical History  Procedure Laterality Date  . Wisdom tooth extraction    . Ercp N/A 06/16/2013    Procedure: ENDOSCOPIC RETROGRADE CHOLANGIOPANCREATOGRAPHY (ERCP);  Surgeon: Malissa HippoNajeeb U Rehman, MD;  Location: [facility];  Service: Endoscopy;  Laterality: N/A;  . Sphincterotomy N/A 06/16/2013    Procedure: SPHINCTEROTOMY, STONE EXTRACTION;  Surgeon: Malissa HippoNajeeb U Rehman, MD;  Location: [facility];  Service: Endoscopy;  Laterality: N/A;  . Cholecystectomy N/A 06/21/2013    Procedure: LAPAROSCOPIC CHOLECYSTECTOMY;  Surgeon: Marlane HatcherWilliam S Bradford, MD;  Location: [facility];  Service: General;  Laterality: N/A;    Family History: Family History  Problem Relation Age of Onset  . Cancer Maternal Grandmother     renal  . Diabetes Maternal Grandmother   . Heart disease Maternal Grandfather   . Heart attack Maternal Grandfather     Social History: History  Substance Use Topics  . Smoking status: Former Smoker -- 0.50 packs/day    Types: Cigarettes    Quit date: 06/28/2008  . Smokeless tobacco: Never Used  . Alcohol Use: Yes     Comment: occ    Allergies:  Allergies  Allergen Reactions  . Latex Rash  . Penicillins Rash      Current outpatient prescriptions:  .  norgestimate-ethinyl estradiol (ORTHO-CYCLEN,SPRINTEC,PREVIFEM) 0.25-35 MG-MCG tablet, Take 1 tablet by mouth daily., Disp: 3 Package, Rfl: 4 .  Norethindrone Acetate-Ethinyl Estrad-FE (LOESTRIN 24 FE) 1-20 MG-MCG(24) tablet, Take 1 tablet by mouth daily., Disp: 1 Package, Rfl: 11  History of  Present Illness: Here for physical/contraception management.  Had normal pap with neg HPV 7/15.  Has been on Sprintec for over a year and has had brown BTB nearly every day for the last several months.  Also has a hemorrhoid remnant that she wants removed because "it's ugly". Warned of pain involving rectal surgery.    Review of Systems   Patient denies any headaches, blurred vision, shortness of breath, chest pain, abdominal pain, problems with bowel movements, urination, or intercourse.   Physical Exam: General:  Well developed, well nourished, no acute distress Skin:  Warm and dry Neck:  Midline trachea, normal thyroid Lungs; Clear to auscultation bilaterally Breast:  No dominant palpable mass, retraction, or nipple discharge Cardiovascular: Regular rate and rhythm Abdomen:  Soft, non tender, no hepatosplenomegaly Pelvic:  External genitalia is normal in appearance.  The vagina is normal in appearance.  The cervix is bulbous.  Uterus is felt to be normal size, shape, and contour.  No adnexal masses or tenderness noted.  Rectum:  `1cm size hemorrhoid remnant Extremities:  No swelling or varicosities noted Psych:  No mood changes.     Impression: Normal physical     Plan: Change to loestrin 24 (doesn't want NR) Pap 7 18 Dr Karilyn Cotaehman to see

## 2015-03-12 NOTE — Patient Instructions (Signed)
Dr Karilyn Cotaehman (gastroenterology)

## 2015-03-15 ENCOUNTER — Telehealth: Payer: Self-pay | Admitting: Advanced Practice Midwife

## 2015-03-15 MED ORDER — NORETHIN-ETH ESTRAD-FE BIPHAS 1 MG-10 MCG / 10 MCG PO TABS: 1.0000 | ORAL_TABLET | Freq: Every day | ORAL | Status: DC

## 2015-03-15 NOTE — Telephone Encounter (Signed)
Pt states her pharmacy will not be able to get her Lo lolestrin to her for another week due to mail order. Sample 1 box of lo estrin FE left at front desk for pickup.

## 2016-01-20 ENCOUNTER — Telehealth: Payer: Self-pay | Admitting: *Deleted

## 2016-01-20 MED ORDER — NORETHIN ACE-ETH ESTRAD-FE 1-20 MG-MCG(24) PO TABS: 1.0000 | ORAL_TABLET | Freq: Every day | ORAL | Status: DC

## 2016-01-20 NOTE — Telephone Encounter (Signed)
Will refill Junel x 3

## 2016-01-24 ENCOUNTER — Telehealth: Payer: Self-pay | Admitting: Obstetrics & Gynecology

## 2016-01-24 NOTE — Telephone Encounter (Signed)
Pt called stating that she has lost her insurance and would like to know if we could give her samples of her birth control medication. I asked Pt what birth control she is on and she can not remember the name. Please contact pt

## 2016-01-24 NOTE — Telephone Encounter (Signed)
Spoke with pt. Pt lost her job and was wondering if we had samples of her birth control pill. Pt is on Junel Fe. I advised we don't have samples of that but call back Tuesday when JAG is here and she may be able to change it to one that we do sample. Pt voiced understanding. JSY

## 2017-08-04 ENCOUNTER — Encounter: Payer: Self-pay | Admitting: Advanced Practice Midwife

## 2017-08-04 ENCOUNTER — Ambulatory Visit: Payer: BLUE CROSS/BLUE SHIELD | Admitting: Advanced Practice Midwife

## 2017-08-04 VITALS — BP 118/66 | HR 80 | Ht 65.0 in | Wt 244.5 lb

## 2017-08-04 DIAGNOSIS — Z3041 Encounter for surveillance of contraceptive pills: Secondary | ICD-10-CM

## 2017-08-04 DIAGNOSIS — Z30011 Encounter for initial prescription of contraceptive pills: Secondary | ICD-10-CM

## 2017-08-04 MED ORDER — NORETHIN ACE-ETH ESTRAD-FE 1-20 MG-MCG PO TABS
1.0000 | ORAL_TABLET | Freq: Every day | ORAL | 11 refills | Status: DC
Start: 2017-08-04 — End: 2019-09-26

## 2017-08-04 NOTE — Patient Instructions (Signed)
Oral Contraception Information Oral contraceptive pills (OCPs) are medicines taken to prevent pregnancy. OCPs work by preventing the ovaries from releasing eggs. The hormones in OCPs also cause the cervical mucus to thicken, preventing the sperm from entering the uterus. The hormones also cause the uterine lining to become thin, not allowing a fertilized egg to attach to the inside of the uterus. OCPs are highly effective when taken exactly as prescribed. However, OCPs do not prevent sexually transmitted diseases (STDs). Safe sex practices, such as using condoms along with the pill, can help prevent STDs. Before taking the pill, you may have a physical exam and Pap test. Your health care provider may order blood tests. The health care provider will make sure you are a good candidate for oral contraception. Discuss with your health care provider the possible side effects of the OCP you may be prescribed. When starting an OCP, it can take 2 to 3 months for the body to adjust to the changes in hormone levels in your body. Types of oral contraception  The combination pill-This pill contains estrogen and progestin (synthetic progesterone) hormones. The combination pill comes in 21-day, 28-day, or 91-day packs. Some types of combination pills are meant to be taken continuously (365-day pills). With 21-day packs, you do not take pills for 7 days after the last pill. With 28-day packs, the pill is taken every day. The last 7 pills are without hormones. Certain types of pills have more than 21 hormone-containing pills. With 91-day packs, the first 84 pills contain both hormones, and the last 7 pills contain no hormones or contain estrogen only.  The minipill-This pill contains the progesterone hormone only. The pill is taken every day continuously. It is very important to take the pill at the same time each day. The minipill comes in packs of 28 pills. All 28 pills contain the hormone. Advantages of oral  contraceptive pills  Decreases premenstrual symptoms.  Treats menstrual period cramps.  Regulates the menstrual cycle.  Decreases a heavy menstrual flow.  May treatacne, depending on the type of pill.  Treats abnormal uterine bleeding.  Treats polycystic ovarian syndrome.  Treats endometriosis.  Can be used as emergency contraception. Things that can make oral contraceptive pills less effective OCPs can be less effective if:  You forget to take the pill at the same time every day.  You have a stomach or intestinal disease that lessens the absorption of the pill.  You take OCPs with other medicines that make OCPs less effective, such as antibiotics, certain HIV medicines, and some seizure medicines.  You take expired OCPs.  You forget to restart the pill on day 7, when using the packs of 21 pills.  Risks associated with oral contraceptive pills Oral contraceptive pills can sometimes cause side effects, such as:  Headache.  Nausea.  Breast tenderness.  Irregular bleeding or spotting.  Combination pills are also associated with a small increased risk of:  Blood clots.  Heart attack.  Stroke.  This information is not intended to replace advice given to you by your health care provider. Make sure you discuss any questions you have with your health care provider. Document Released: 11/07/2002 Document Revised: 01/23/2016 Document Reviewed: 02/05/2013 Elsevier Interactive Patient Education  2018 Elsevier Inc.  

## 2017-08-04 NOTE — Progress Notes (Signed)
[facility] Visit  Patient name: Catherine Bishop Mincy MRN [id number]  Date of birth: 05/26/1981  CC & HPI:  Catherine Bishop Cosper is a 36 y.o. Caucasian female [facility]  [facility].  Is abstinent, but wants to go back on Junel.  Has been bleeding twice a month (q 3 weeks or so ) for all her life.   Has been getting yearly paps at [facility] d/t no insurance, all normal, last one < 1 year ago.   Pertinent History Reviewed:  Medical & Surgical Hx:   Past Medical History:  Diagnosis Date  . Anxiety   . Epilepsy (HCC)    childhood last seizure 36yo, no meds  . GDM (gestational diabetes mellitus)   . Gestational diabetes    diet controlled  . Hypertension    pregnancy only  . Irregular bleeding   . Left ovarian cyst   . Pelvic pain in female   . Tilted uterus    Past Surgical History:  Procedure Laterality Date  . CHOLECYSTECTOMY N/A 06/21/2013   Procedure: LAPAROSCOPIC CHOLECYSTECTOMY;  Surgeon: Marlane HatcherWilliam S Bradford, MD;  Location: [facility];  Service: General;  Laterality: N/A;  . ERCP N/A 06/16/2013   Procedure: ENDOSCOPIC RETROGRADE CHOLANGIOPANCREATOGRAPHY (ERCP);  Surgeon: Malissa HippoNajeeb U Rehman, MD;  Location: [facility];  Service: Endoscopy;  Laterality: N/A;  . SPHINCTEROTOMY N/A 06/16/2013   Procedure: SPHINCTEROTOMY, STONE EXTRACTION;  Surgeon: Malissa HippoNajeeb U Rehman, MD;  Location: [facility];  Service: Endoscopy;  Laterality: N/A;  . WISDOM TOOTH EXTRACTION     Family History  Problem Relation Age of Onset  . Cancer Maternal Grandmother        renal  . Diabetes Maternal Grandmother   . Heart disease Maternal Grandfather   . Heart attack Maternal Grandfather     Current Outpatient Medications:  .  Norethindrone Acetate-Ethinyl Estrad-FE (LOESTRIN 24 FE) 1-20 MG-MCG(24) tablet, Take 1 tablet by mouth daily. (Patient not taking: Reported on 08/04/2017), Disp: 1 Package, Rfl: 3 Social History: Reviewed -  reports that she quit smoking about 9 years ago. Her smoking  use included cigarettes. She smoked 0.50 packs per day. she has never used smokeless tobacco.  Review of Systems:   Constitutional: Negative for fever and chills Eyes: Negative for visual disturbances Respiratory: Negative for shortness of breath, dyspnea Cardiovascular: Negative for chest pain or palpitations  Gastrointestinal: Negative for vomiting, diarrhea and constipation; no abdominal pain Genitourinary: Negative for dysuria and urgency, vaginal irritation or itching Musculoskeletal: Negative for back pain, joint pain, myalgias  Neurological: Negative for dizziness and headaches    Objective Findings:    Physical Examination: General appearance - well appearing, and in no distress Mental status - alert, oriented to person, place, and time Chest:  Normal respiratory effort Heart - normal rate and regular rhythm Abdomen:  Soft, nontender Pelvic: SSE:   normal appearing discharge, cervix non friable Musculoskeletal:  Normal range of motion without pain Extremities:  No edema    No results found for this or any previous visit (from the past 24 hour(s)).    Assessment & Plan:  A:   Contraception mgt; probably short cycles P:  Start Pill with next period. .     No Follow-up on file.  CRESENZO-DISHMAN,Cherish Runde CNM 08/04/2017 9:11 AM

## 2018-11-04 ENCOUNTER — Ambulatory Visit (INDEPENDENT_AMBULATORY_CARE_PROVIDER_SITE_OTHER): Payer: No Typology Code available for payment source | Admitting: Medical

## 2018-11-04 ENCOUNTER — Encounter: Payer: Self-pay | Admitting: Medical

## 2018-11-04 VITALS — BP 110/76 | HR 72 | Temp 97.9°F | Ht 65.0 in | Wt 246.6 lb

## 2018-11-04 DIAGNOSIS — R51 Headache: Secondary | ICD-10-CM

## 2018-11-04 DIAGNOSIS — Z0000 Encounter for general adult medical examination without abnormal findings: Secondary | ICD-10-CM | POA: Diagnosis not present

## 2018-11-04 DIAGNOSIS — R519 Headache, unspecified: Secondary | ICD-10-CM

## 2018-11-04 LAB — POCT URINALYSIS DIP (PROADVANTAGE DEVICE)
Bilirubin, UA: NEGATIVE
Glucose, UA: NEGATIVE mg/dL
Ketones, POC UA: NEGATIVE mg/dL
Leukocytes, UA: NEGATIVE
NITRITE UA: NEGATIVE
PROTEIN UA: NEGATIVE mg/dL
Specific Gravity, Urine: 1.015
Urobilinogen, Ur: NEGATIVE
pH, UA: 6 (ref 5.0–8.0)

## 2018-11-04 NOTE — Patient Instructions (Signed)

## 2018-11-04 NOTE — Progress Notes (Signed)
Subjective:   HPI  Catherine Bishop is a 38 y.o. female who presents for Chief Complaint  Patient presents with  . other    New pt CPE / establish care   Medical care team includes: Avanell Shackleton, NP-C here for primary care Dentist Eye doctor Dr. Marzella Schlein  Concerns: Gets some headaches, no hx/o migraines.     Past Medical History:  Diagnosis Date  . Anxiety   . Epilepsy (HCC)    childhood last seizure 38yo, no meds  . GDM (gestational diabetes mellitus)   . Gestational diabetes    diet controlled  . Hypertension    pregnancy only  . Irregular bleeding   . Left ovarian cyst   . Pelvic pain in female   . Tilted uterus     Past Surgical History:  Procedure Laterality Date  . CHOLECYSTECTOMY N/A 06/21/2013   Procedure: LAPAROSCOPIC CHOLECYSTECTOMY;  Surgeon: Marlane Hatcher, MD;  Location: [facility];  Service: General;  Laterality: N/A;  . ERCP N/A 06/16/2013   Procedure: ENDOSCOPIC RETROGRADE CHOLANGIOPANCREATOGRAPHY (ERCP);  Surgeon: Malissa Hippo, MD;  Location: [facility];  Service: Endoscopy;  Laterality: N/A;  . LASIK    . SPHINCTEROTOMY N/A 06/16/2013   Procedure: SPHINCTEROTOMY, STONE EXTRACTION;  Surgeon: Malissa Hippo, MD;  Location: [facility];  Service: Endoscopy;  Laterality: N/A;  . WISDOM TOOTH EXTRACTION      Social History   Socioeconomic History  . Marital status: Single    Spouse name: Not on file  . Number of children: Not on file  . Years of education: Not on file  . Highest education level: Not on file  Occupational History  . Not on file  Social Needs  . Financial resource strain: Not on file  . Food insecurity:    Worry: Not on file    Inability: Not on file  . Transportation needs:    Medical: Not on file    Non-medical: Not on file  Tobacco Use  . Smoking status: Former Smoker    Packs/day: 0.50    Types: Cigarettes    Last attempt to quit: 06/28/2008    Years since quitting: 10.3  . Smokeless tobacco: Never Used   Substance and Sexual Activity  . Alcohol use: Yes    Comment: occ  . Drug use: No  . Sexual activity: Yes    Birth control/protection: Pill  Lifestyle  . Physical activity:    Days per week: Not on file    Minutes per session: Not on file  . Stress: Not on file  Relationships  . Social connections:    Talks on phone: Not on file    Gets together: Not on file    Attends religious service: Not on file    Active member of club or organization: Not on file    Attends meetings of clubs or organizations: Not on file    Relationship status: Not on file  . Intimate partner violence:    Fear of current or ex partner: Not on file    Emotionally abused: Not on file    Physically abused: Not on file    Forced sexual activity: Not on file  Other Topics Concern  . Not on file  Social History Narrative   Lives with her 2 children, works in billing at [employer], exercise - walking the dog.  No spouse or boyfriend.  10/2018.      Family History  Problem Relation Age of Onset  .  Cancer Maternal Grandmother        renal  . Diabetes Maternal Grandmother   . Heart disease Maternal Grandfather   . Heart attack Maternal Grandfather   . Cancer Maternal Aunt        pancreatic  . Hypertension Neg Hx   . Hyperlipidemia Neg Hx   . Stroke Neg Hx      Current Outpatient Medications:  .  ascorbic acid (VITAMIN C) 250 MG CHEW, Chew 250 mg by mouth daily., Disp: , Rfl:  .  Aspirin-Acetaminophen-Caffeine (EXCEDRIN MIGRAINE PO), Take 2 tablets by mouth., Disp: , Rfl:  .  norethindrone-ethinyl estradiol (JUNEL FE 1/20) 1-20 MG-MCG tablet, Take 1 tablet by mouth daily. (Patient not taking: Reported on 11/04/2018), Disp: 1 Package, Rfl: 11  Allergies  Allergen Reactions  . Latex Rash  . Penicillins Rash     Reviewed their medical, surgical, family, social, medication, and allergy history and updated chart as appropriate.   Review of Systems Constitutional: -fever, -chills, -sweats,  -unexpected weight change, -decreased appetite, -fatigue Allergy: -sneezing, -itching, -congestion Dermatology: -changing moles, --rash, -lumps ENT: -runny nose, -ear pain, -sore throat, -hoarseness, -sinus pain, -teeth pain, - ringing in ears, -hearing loss, -nosebleeds Cardiology: -chest pain, -palpitations, -swelling, -difficulty breathing when lying flat, -waking up short of breath Respiratory: -cough, -shortness of breath, -difficulty breathing with exercise or exertion, -wheezing, -coughing up blood Gastroenterology: -abdominal pain, -nausea, -vomiting, -diarrhea, -constipation, -blood in stool, -changes in bowel movement, -difficulty swallowing or eating Hematology: -bleeding, -bruising  Musculoskeletal: -joint aches, -muscle aches, -joint swelling, -back pain, -neck pain, -cramping, -changes in gait Ophthalmology: denies vision changes, eye redness, itching, discharge Urology: -burning with urination, -difficulty urinating, -blood in urine, -urinary frequency, -urgency, -incontinence Neurology: -headache, -weakness, -tingling, -numbness, -memory loss, -falls, -dizziness Psychology: -depressed mood, -agitation, -sleep problems Breast/gyn: -breast tenderness, -discharge, -lumps, -vaginal discharge,- irregular periods, -heavy periods     Objective:  BP 110/76 (BP Location: Left Arm, Patient Position: Sitting)   Pulse 72   Temp 97.9 F (36.6 C)   Ht 5\' 5"  (1.651 m)   Wt 246 lb 9.6 oz (111.9 kg)   LMP 11/02/2018 (Exact Date)   SpO2 99%   BMI 41.04 kg/m   General appearance: alert, no distress, WD/WN, Caucasian female Skin: left low back with 68mm brown oval flat lesion with darker center, no other worrisome lesions HEENT: normocephalic, conjunctiva/corneas normal, sclerae anicteric, PERRLA, EOMi, nares patent, no discharge or erythema, pharynx normal Oral cavity: MMM, tongue normal, teeth normal Neck: supple, no lymphadenopathy, no thyromegaly, no masses, normal ROM, no  bruits Chest: non tender, normal shape and expansion Heart: RRR, normal S1, S2, no murmurs Lungs: CTA bilaterally, no wheezes, rhonchi, or rales Abdomen: +bs, soft, non tender, non distended, no masses, no hepatomegaly, no splenomegaly, no bruits Back: non tender, normal ROM, no scoliosis Musculoskeletal: upper extremities non tender, no obvious deformity, normal ROM throughout, lower extremities non tender, no obvious deformity, normal ROM throughout Extremities: no edema, no cyanosis, no clubbing Pulses: 2+ symmetric, upper and lower extremities, normal cap refill Neurological: alert, oriented x 3, CN2-12 intact, strength normal upper extremities and lower extremities, sensation normal throughout, DTRs 2+ throughout, no cerebellar signs, gait normal Psychiatric: normal affect, behavior normal, pleasant  Breast/gyn/rectal - deferred to gynecology    Assessment and Plan :   Encounter Diagnoses  Name Primary?  . Encounter for health maintenance examination in adult Yes  . Nonintractable headache, unspecified chronicity pattern, unspecified headache type     Physical exam -  discussed and counseled on healthy lifestyle, diet, exercise, preventative care, vaccinations, sick and well care, proper use of emergency dept and after hours care, and addressed their concerns.    Health screening: Advised they see their eye doctor yearly for routine vision care. Advised they see their dentist yearly for routine dental care including hygiene visits twice yearly.  Cancer screening Counseled on self breast exams, mammograms, cervical cancer screening  Vaccinations: Advised yearly influenza vaccine  Acute issues discussed: none  Separate significant chronic issues discussed: Headaches - advised headache diary, f/u if persistent headaches  Obesity - work on lifestyle changes  Skin lesion left lower back- we took a picture in today with her phone with the ruler beside, so she can compare  within the next 2 to 3 months.  Lyrik was seen today for other.  Diagnoses and all orders for this visit:  Encounter for health maintenance examination in adult -     POCT Urinalysis DIP (Proadvantage Device) -     Comprehensive metabolic panel -     Lipid panel -     TSH -     Hemoglobin A1c -     CBC  Nonintractable headache, unspecified chronicity pattern, unspecified headache type    Follow-up pending labs, yearly for physical

## 2018-11-05 LAB — COMPREHENSIVE METABOLIC PANEL
ALK PHOS: 108 IU/L (ref 39–117)
ALT: 15 IU/L (ref 0–32)
AST: 17 IU/L (ref 0–40)
Albumin/Globulin Ratio: 1.7 (ref 1.2–2.2)
Albumin: 4.4 g/dL (ref 3.8–4.8)
BUN/Creatinine Ratio: 12 (ref 9–23)
BUN: 8 mg/dL (ref 6–20)
Bilirubin Total: 0.2 mg/dL (ref 0.0–1.2)
CALCIUM: 9.9 mg/dL (ref 8.7–10.2)
CO2: 22 mmol/L (ref 20–29)
Chloride: 101 mmol/L (ref 96–106)
Creatinine, Ser: 0.66 mg/dL (ref 0.57–1.00)
GFR calc Af Amer: 131 mL/min/{1.73_m2} (ref >59)
GFR calc non Af Amer: 113 mL/min/{1.73_m2} (ref >59)
GLOBULIN, TOTAL: 2.6 g/dL (ref 1.5–4.5)
Glucose: 83 mg/dL (ref 65–99)
POTASSIUM: 4.5 mmol/L (ref 3.5–5.2)
Sodium: 140 mmol/L (ref 134–144)
Total Protein: 7 g/dL (ref 6.0–8.5)

## 2018-11-05 LAB — CBC
Hematocrit: 39.3 % (ref 34.0–46.6)
Hemoglobin: 13.1 g/dL (ref 11.1–15.9)
MCH: 27.3 pg (ref 26.6–33.0)
MCHC: 33.3 g/dL (ref 31.5–35.7)
MCV: 82 fL (ref 79–97)
Platelets: 376 10*3/uL (ref 150–450)
RBC: 4.8 x10E6/uL (ref 3.77–5.28)
RDW: 13.9 % (ref 11.7–15.4)
WBC: 9.2 10*3/uL (ref 3.4–10.8)

## 2018-11-05 LAB — LIPID PANEL
CHOLESTEROL TOTAL: 187 mg/dL (ref 100–199)
Chol/HDL Ratio: 4 ratio (ref 0.0–4.4)
HDL: 47 mg/dL (ref >39)
LDL Calculated: 117 mg/dL — ABNORMAL HIGH (ref 0–99)
TRIGLYCERIDES: 117 mg/dL (ref 0–149)
VLDL Cholesterol Cal: 23 mg/dL (ref 5–40)

## 2018-11-05 LAB — HEMOGLOBIN A1C
ESTIMATED AVERAGE GLUCOSE: 114 mg/dL
Hgb A1c MFr Bld: 5.6 % (ref 4.8–5.6)

## 2018-11-05 LAB — TSH: TSH: 1.67 u[IU]/mL (ref 0.450–4.500)

## 2019-09-12 ENCOUNTER — Ambulatory Visit: Payer: Self-pay | Admitting: Advanced Practice Midwife

## 2019-09-20 ENCOUNTER — Ambulatory Visit: Payer: Self-pay | Admitting: Advanced Practice Midwife

## 2019-09-26 ENCOUNTER — Other Ambulatory Visit: Payer: Self-pay

## 2019-09-26 ENCOUNTER — Encounter: Payer: Self-pay | Admitting: Advanced Practice Midwife

## 2019-09-26 ENCOUNTER — Ambulatory Visit (INDEPENDENT_AMBULATORY_CARE_PROVIDER_SITE_OTHER): Payer: Commercial Managed Care - PPO | Admitting: Advanced Practice Midwife

## 2019-09-26 ENCOUNTER — Other Ambulatory Visit (HOSPITAL_COMMUNITY)
Admission: RE | Admit: 2019-09-26 | Discharge: 2019-09-26 | Disposition: A | Discharge status: Home / Self Care | Payer: Commercial Managed Care - PPO | Source: Ambulatory Visit | Attending: Advanced Practice Midwife | Admitting: Advanced Practice Midwife

## 2019-09-26 VITALS — BP 119/78 | HR 83 | Ht 65.0 in | Wt 256.5 lb

## 2019-09-26 DIAGNOSIS — Z01419 Encounter for gynecological examination (general) (routine) without abnormal findings: Secondary | ICD-10-CM

## 2019-09-26 DIAGNOSIS — Z01411 Encounter for gynecological examination (general) (routine) with abnormal findings: Secondary | ICD-10-CM

## 2019-09-26 DIAGNOSIS — N921 Excessive and frequent menstruation with irregular cycle: Secondary | ICD-10-CM | POA: Diagnosis not present

## 2019-09-26 NOTE — Progress Notes (Signed)
   GYN VISIT Patient name: Catherine Bishop MRN [id number]  Date of birth: 1980/12/16 Chief Complaint:   Pap only  History of Present Illness:   Catherine Bishop is a 39 y.o. [id number] Caucasian female being seen today for Pap only. She had a physical in March by her PCP. Does report menometrorrhagia and is considering contraception for cycle control. States her cycles can be 7-9 days long, sometimes a couple of times per month. Not currently sexually active.    Patient's last menstrual period was 09/12/2019 (approximate). The current method of family planning is none.  Last pap 2015. Results were:  normal Review of Systems:   Pertinent items are noted in HPI Denies fever/chills, dizziness, headaches, visual disturbances, fatigue, shortness of breath, chest pain, abdominal pain, vomiting, abnormal vaginal discharge/itching/odor/irritation, problems with periods, bowel movements, urination, or intercourse unless otherwise stated above.  Pertinent History Reviewed:  Reviewed past medical,surgical, social, obstetrical and family history.  Reviewed problem list, medications and allergies. Physical Assessment:   Vitals:   09/26/19 0833  BP: 119/78  Pulse: 83  Weight: 256 lb 8 oz (116.3 kg)  Height: 5\' 5"  (1.651 m)  Body mass index is 42.68 kg/m.       Physical Examination:   General appearance: alert, well appearing, and in no distress  Mental status: alert, oriented to person, place, and time  Skin: warm & dry   Cardiovascular: normal heart rate noted  Respiratory: normal respiratory effort, no distress  Abdomen: soft, non-tender   Pelvic: normal external genitalia, vulva, vagina, cervix, uterus and adnexa  Extremities: no edema   Chaperone:    No results found for this or any previous visit (from the past 24 hour(s)).  Assessment & Plan:  1) Pap> will send note with result  2) Menometrorrhagia> will get CBC today; she will schedule a visit if she wants to discuss  further hormonal management  Meds: No orders of the defined types were placed in this encounter.   Orders Placed This Encounter  Procedures  . CBC    No follow-ups on file.  Malachy Mood CNM 09/26/2019 8:53 AM

## 2019-09-26 NOTE — Patient Instructions (Signed)
Menorrhagia Menorrhagia is when your menstrual periods are heavy or last longer than normal. Follow these instructions at home: Medicines   Take over-the-counter and prescription medicines exactly as told by your doctor. This includes iron pills.  Do not change or switch medicines without asking your doctor.  Do not take aspirin or medicines that contain aspirin 1 week before or during your period. Aspirin may make bleeding worse. General instructions  If you need to change your pad or tampon more than once every 2 hours, limit your activity until the bleeding stops.  Iron pills can cause problems when pooping (constipation). To prevent or treat pooping problems while taking prescription iron pills, your doctor may suggest that you: ? Drink enough fluid to keep your pee (urine) clear or pale yellow. ? Take over-the-counter or prescription medicines. ? Eat foods that are high in fiber. These foods include:  Fresh fruits and vegetables.  Whole grains.  Beans. ? Limit foods that are high in fat and processed sugars. This includes fried and sweet foods.  Eat healthy meals and foods that are high in iron. Foods that have a lot of iron include: ? Leafy green vegetables. ? Meat. ? Liver. ? Eggs. ? Whole grain breads and cereals.  Do not try to lose weight until your heavy bleeding has stopped and you have normal amounts of iron in your blood. If you need to lose weight, work with your doctor.  Keep all follow-up visits as told by your doctor. This is important. Contact a doctor if:  You soak through a pad or tampon every 1 or 2 hours, and this happens every time you have a period.  You need to use pads and tampons at the same time because you are bleeding so much.  You are taking medicine and you: ? Feel sick to your stomach (nauseous). ? Throw up (vomit). ? Have watery poop (diarrhea).  You have other problems that may be related to the medicine you are taking. Get help  right away if:  You soak through more than a pad or tampon in 1 hour.  You pass clots bigger than 1 inch (2.5 cm) wide.  You feel short of breath.  You feel like your heart is beating too fast.  You feel dizzy or you pass out (faint).  You feel very weak or tired. Summary  Menorrhagia is when your menstrual periods are heavy or last longer than normal.  Take over-the-counter and prescription medicines exactly as told by your doctor. This includes iron pills.  Contact a doctor if you soak through more than a pad or tampon in 1 hour or are passing large clots. This information is not intended to replace advice given to you by your health care provider. Make sure you discuss any questions you have with your health care provider. Document Revised: 11/24/2017 Document Reviewed: 09/07/2016 Elsevier Patient Education  2020 Elsevier Inc.  

## 2019-09-27 LAB — CYTOLOGY - PAP
Comment: NEGATIVE
Diagnosis: NEGATIVE
High risk HPV: NEGATIVE

## 2020-05-30 ENCOUNTER — Encounter: Payer: Self-pay | Admitting: Medical

## 2020-06-03 ENCOUNTER — Encounter: Payer: Self-pay | Admitting: Medical

## 2020-06-03 ENCOUNTER — Other Ambulatory Visit: Payer: Self-pay

## 2020-06-03 ENCOUNTER — Ambulatory Visit (INDEPENDENT_AMBULATORY_CARE_PROVIDER_SITE_OTHER): Payer: Commercial Managed Care - PPO | Admitting: Medical

## 2020-06-03 VITALS — BP 110/80 | HR 80 | Ht 65.0 in | Wt 267.6 lb

## 2020-06-03 DIAGNOSIS — N920 Excessive and frequent menstruation with regular cycle: Secondary | ICD-10-CM | POA: Diagnosis not present

## 2020-06-03 DIAGNOSIS — Z2820 Immunization not carried out because of patient decision for unspecified reason: Secondary | ICD-10-CM

## 2020-06-03 DIAGNOSIS — Z309 Encounter for contraceptive management, unspecified: Secondary | ICD-10-CM | POA: Insufficient documentation

## 2020-06-03 LAB — POCT URINE PREGNANCY: Preg Test, Ur: NEGATIVE

## 2020-06-03 MED ORDER — NORGESTIM-ETH ESTRAD TRIPHASIC 0.18/0.215/0.25 MG-35 MCG PO TABS: 1.0000 | ORAL_TABLET | Freq: Every day | ORAL | 11 refills | Status: AC

## 2020-06-03 NOTE — Progress Notes (Signed)
Established patient visit   Patient: Catherine Bishop   DOB: 13-Apr-1981   39 y.o. Female  MRN: [id number] Visit Date: 06/03/2020  Today's healthcare provider: Kristian Covey, PA-C   Chief Complaint  Patient presents with  . Consult    discuss birth control  . Contraception  I,Shane Coralie Stanke,acting as a Neurosurgeon for [employer], PA-C.,have documented all relevant documentation on the behalf of Kristian Covey, PA-C,as directed by  [employer], PA-C while in the presence of [employer], PA-C.  Subjective    HPI HPI    Consult     Additional comments: discuss birth control       Last edited by Margo Common, CMA on 06/03/2020  9:55 AM. (History)      Birth Control Patient presents today for possibly starting birth control. She reports that OBGYN told her to wait for CPE with her primary care provider to discuss birth control. Patient reports history of heavy periods for years and they are regular for 21 days. No pain with intercourse or vaginal discharge. Non smoker.No recent pelvic [place]. No cyst of ovaries in past.Last birth control a few years ago,always oral.   3 pregnancy: 2 alive, 1 miscarriage Sexually active:1 partner and no concern for STI/STD LMP: 06/02/2020  No other c/o   Medications: Outpatient Medications Prior to Visit  Medication Sig  . Aspirin-Acetaminophen-Caffeine (EXCEDRIN MIGRAINE PO) Take 2 tablets by mouth. (Patient not taking: Reported on 06/03/2020)   No facility-administered medications prior to visit.    Review of Systems As in subjective   Objective    BP 110/80   Pulse 80   Ht 5\' 5"  (1.651 m)   Wt 267 lb 9.6 oz (121.4 kg)   LMP 06/02/2020 (Exact Date)   SpO2 98%   BMI 44.53 kg/m   Wt Readings from Last 3 Encounters:  06/03/20 267 lb 9.6 oz (121.4 kg)  09/26/19 256 lb 8 oz (116.3 kg)  11/04/18 246 lb 9.6 oz (111.9 kg)    Physical Exam  General well-developed well-nourished no acute distress  I reviewed her Pap  smear from January 2021 that was negative for cancer and negative HPV, [facility]     Assessment & Plan    1. Encounter for contraceptive management, unspecified type  We discussed her interest in oral hormonal contraception.  Discussed treatment options, types of OCPs, various other methods available.  Discussed non hormonal contraception as well.  We discussed risks of OCPs including headache, nausea, breast tenderness, irregular bleeding or spotting, possible risks of blood clots, heart attack, stroke.     Discussed advantages of OCPs including possible decreased premenstrual symptoms, improving cramps, regulating cycles, decreasing heavy flow.  Discussed proper use of medication.  She understands the benefits, risks, and wishes to begin OCPs. Urine pregnancy negative.    She has been on NuvaRing in the past without success, did not tolerate this very well. She does not want to pursue IUD or Nexplanon or Depo shot  Discussed safe sex, STD prevention, condom use.      Begin medication below  She declines flu and Covid vaccines today  Return for physical fasting as planned in 2 weeks  [facility], PA-C Ebbie was seen today for consult and contraception.  Diagnoses and all orders for this visit:  Encounter for contraceptive management, unspecified type -     POCT urine pregnancy  Vaccine refused by patient  Menorrhagia  with regular cycle -     POCT urine pregnancy  Other orders -     Norgestimate-Ethinyl Estradiol Triphasic (ORTHO TRI-CYCLEN, 28,) 0.18/0.215/0.25 MG-35 MCG tablet; Take 1 tablet by mouth daily.    [facility] [phone] (phone) [phone] (fax)  [facility]

## 2020-06-20 ENCOUNTER — Ambulatory Visit (INDEPENDENT_AMBULATORY_CARE_PROVIDER_SITE_OTHER): Payer: Commercial Managed Care - PPO | Admitting: Medical

## 2020-06-20 ENCOUNTER — Encounter: Payer: Self-pay | Admitting: Medical

## 2020-06-20 ENCOUNTER — Other Ambulatory Visit: Payer: Self-pay

## 2020-06-20 VITALS — BP 132/90 | HR 86 | Ht 65.0 in | Wt 266.6 lb

## 2020-06-20 DIAGNOSIS — Z8349 Family history of other endocrine, nutritional and metabolic diseases: Secondary | ICD-10-CM

## 2020-06-20 DIAGNOSIS — Z7185 Encounter for immunization safety counseling: Secondary | ICD-10-CM

## 2020-06-20 DIAGNOSIS — Z13220 Encounter for screening for lipoid disorders: Secondary | ICD-10-CM | POA: Diagnosis not present

## 2020-06-20 DIAGNOSIS — Z0000 Encounter for general adult medical examination without abnormal findings: Secondary | ICD-10-CM | POA: Diagnosis not present

## 2020-06-20 DIAGNOSIS — G479 Sleep disorder, unspecified: Secondary | ICD-10-CM

## 2020-06-20 DIAGNOSIS — Z131 Encounter for screening for diabetes mellitus: Secondary | ICD-10-CM

## 2020-06-20 DIAGNOSIS — Z1329 Encounter for screening for other suspected endocrine disorder: Secondary | ICD-10-CM | POA: Diagnosis not present

## 2020-06-20 DIAGNOSIS — R400 Somnolence: Secondary | ICD-10-CM

## 2020-06-20 DIAGNOSIS — Z6841 Body Mass Index (BMI) 40.0 and over, adult: Secondary | ICD-10-CM

## 2020-06-20 DIAGNOSIS — R0683 Snoring: Secondary | ICD-10-CM

## 2020-06-20 NOTE — Progress Notes (Signed)
Subjective:   HPI  Catherine Bishop is a 39 y.o. female who presents for Chief Complaint  Patient presents with  . Annual Exam    with fasting labs already drawn     Patient Care Team: Mikal Blasdell, Cleda Mccreedy as PCP - General (Family Medicine) Sees dentist: Dr. Maude Leriche eye doctor:n/a   Concerns:none   Gynecological history: last pap 09/26/19 3 pregnancies, 2 live births. Periods are Regular, periods are heavy. patient sees gynecology for gynecology care. Center for [facility]  She does not sleep well.  She wakes up not feeling rested.  She gets sleepy during the day.  Mom has a history of sleep apnea.  She denies prior sleep study.  She does get fatigue.  She gets headaches sometimes.  No other complaint  Past Medical History:  Diagnosis Date  . Anxiety   . Epilepsy (HCC)    childhood last seizure 39yo, no meds  . GDM (gestational diabetes mellitus)   . Gestational diabetes    diet controlled  . Hypertension    pregnancy only  . Irregular bleeding   . Left ovarian cyst   . Pelvic pain in female   . Tilted uterus     Family History  Problem Relation Age of Onset  . Cancer Maternal Grandmother        renal  . Diabetes Maternal Grandmother   . Heart disease Maternal Grandfather   . Heart attack Maternal Grandfather   . Thyroid disease Mother   . Cancer Maternal Aunt        pancreatic  . Hypertension Neg Hx   . Hyperlipidemia Neg Hx   . Stroke Neg Hx      Current Outpatient Medications:  .  Norgestimate-Ethinyl Estradiol Triphasic (ORTHO TRI-CYCLEN, 28,) 0.18/0.215/0.25 MG-35 MCG tablet, Take 1 tablet by mouth daily., Disp: 28 tablet, Rfl: 11 .  Aspirin-Acetaminophen-Caffeine (EXCEDRIN MIGRAINE PO), Take 2 tablets by mouth. (Patient not taking: Reported on 06/03/2020), Disp: , Rfl:   Allergies  Allergen Reactions  . Latex Rash  . Penicillins Rash     Reviewed their medical, surgical, family, social, medication, and allergy history and  updated chart as appropriate.   Review of Systems Constitutional: -fever, -chills, -sweats, -unexpected weight change, -decreased appetite, +fatigue Allergy: -sneezing, -itching, -congestion Dermatology: -changing moles, --rash, -lumps ENT: -runny nose, -ear pain, -sore throat, -hoarseness, -sinus pain, -teeth pain, - ringing in ears, -hearing loss, -nosebleeds Cardiology: -chest pain, -palpitations, -swelling, -difficulty breathing when lying flat, -waking up short of breath Respiratory: -cough, -shortness of breath, -difficulty breathing with exercise or exertion, -wheezing, -coughing up blood Gastroenterology: -abdominal pain, -nausea, -vomiting, -diarrhea, -constipation, -blood in stool, -changes in bowel movement, -difficulty swallowing or eating Hematology: -bleeding, -bruising  Musculoskeletal: -joint aches, -muscle aches, -joint swelling, -back pain, -neck pain, -cramping, -changes in gait Ophthalmology: denies vision changes, eye redness, itching, discharge Urology: -burning with urination, -difficulty urinating, -blood in urine, -urinary frequency, -urgency, -incontinence Neurology: -headache, -weakness, -tingling, -numbness, -memory loss, -falls, -dizziness Psychology: -depressed mood, -agitation, +sleep problems Breast/gyn: -breast tendnerss, -discharge, -lumps, -vaginal discharge,- irregular periods, -heavy periods     Objective:  BP 132/90   Pulse 86   Ht 5\' 5"  (1.651 m)   Wt 266 lb 9.6 oz (120.9 kg)   LMP 06/02/2020 (Exact Date)   SpO2 97%   BMI 44.36 kg/m   Wt Readings from Last 3 Encounters:  06/20/20 266 lb 9.6 oz (120.9 kg)  06/03/20 267 lb 9.6 oz (121.4 kg)  09/26/19 256 lb 8 oz (116.3 kg)   BP Readings from Last 3 Encounters:  06/20/20 132/90  06/03/20 110/80  09/26/19 119/78    General appearance: alert, no distress, WD/WN, Caucasian female Skin: left low back with 41mm brown oval flat lesion with darker center, HEENT: normocephalic, conjunctiva/corneas  normal, sclerae anicteric, PERRLA, EOMi, nares patent, no discharge or erythema, pharynx normal Oral cavity: MMM, tongue normal, teeth in good repair Neck: supple, no lymphadenopathy, no thyromegaly, no masses, normal ROM, no bruits Chest: non tender, normal shape and expansion Heart: RRR, normal S1, S2, no murmurs Lungs: CTA bilaterally, no wheezes, rhonchi, or rales Abdomen: +bs, soft, non tender, non distended, no masses, no hepatomegaly, no splenomegaly, no bruits Back: non tender, normal ROM, no scoliosis Musculoskeletal: upper extremities non tender, no obvious deformity, normal ROM throughout, lower extremities non tender, no obvious deformity, normal ROM throughout Extremities: no edema, no cyanosis, no clubbing Pulses: 2+ symmetric, upper and lower extremities, normal cap refill Neurological: alert, oriented x 3, CN2-12 intact, strength normal upper extremities and lower extremities, sensation normal throughout, DTRs 2+ throughout, no cerebellar signs, gait normal Psychiatric: normal affect, behavior normal, pleasant  Breast/gyn/rectal - deferred   Assessment and Plan :   Encounter Diagnoses  Name Primary?  . Encounter for health maintenance examination in adult Yes  . Screening for lipid disorders   . Screening for diabetes mellitus   . Screening for thyroid disorder   . Family history of thyroid disease   . Vaccine counseling   . Sleep disturbance   . Snoring   . Daytime somnolence   . BMI 40.0-44.9, adult [facility])     Physical exam - discussed and counseled on healthy lifestyle, diet, exercise, preventative care, vaccinations, sick and well care, proper use of emergency dept and after hours care, and addressed their concerns.    Health screening: Advised they see their eye doctor yearly for routine vision care. Advised they see their dentist yearly for routine dental care including hygiene visits twice yearly.  Cancer screening Counseled on self breast exams,  mammograms, cervical cancer screening  Colonoscopy:  Age 42yo  mammo age 52  Pap on file reviewed, up to date   Vaccinations: Advised yearly influenza vaccine Declines flu and covid vaccines   Separate significant issues discussed: Referral for sleep study  Obesity - counseled on need to work on lifestyle changes, weight loss   Bellarae was seen today for annual exam.  Diagnoses and all orders for this visit:  Encounter for health maintenance examination in adult -     Comprehensive metabolic panel -     CBC -     Hemoglobin A1c -     TSH -     Lipid panel  Screening for lipid disorders -     Lipid panel  Screening for diabetes mellitus -     Hemoglobin A1c  Screening for thyroid disorder  Family history of thyroid disease  Vaccine counseling  Sleep disturbance  Snoring  Daytime somnolence  BMI 40.0-44.9, adult (HCC)    Follow-up pending labs, yearly for physical

## 2020-06-21 LAB — LIPID PANEL

## 2020-06-22 LAB — COMPREHENSIVE METABOLIC PANEL
ALT: 27 IU/L (ref 0–32)
AST: 20 IU/L (ref 0–40)
Albumin/Globulin Ratio: 1.5 (ref 1.2–2.2)
Albumin: 4 g/dL (ref 3.8–4.8)
Alkaline Phosphatase: 96 IU/L (ref 44–121)
BUN/Creatinine Ratio: 12 (ref 9–23)
BUN: 8 mg/dL (ref 6–20)
Bilirubin Total: <0.2 mg/dL (ref 0.0–1.2)
CO2: 21 mmol/L (ref 20–29)
Calcium: 9.1 mg/dL (ref 8.7–10.2)
Chloride: 101 mmol/L (ref 96–106)
Creatinine, Ser: 0.66 mg/dL (ref 0.57–1.00)
GFR calc Af Amer: 129 mL/min/{1.73_m2} (ref >59)
GFR calc non Af Amer: 112 mL/min/{1.73_m2} (ref >59)
Globulin, Total: 2.6 g/dL (ref 1.5–4.5)
Glucose: 77 mg/dL (ref 65–99)
Potassium: 4.7 mmol/L (ref 3.5–5.2)
Sodium: 137 mmol/L (ref 134–144)
Total Protein: 6.6 g/dL (ref 6.0–8.5)

## 2020-06-22 LAB — CBC
Hematocrit: 38.4 % (ref 34.0–46.6)
Hemoglobin: 13.2 g/dL (ref 11.1–15.9)
MCH: 31.2 pg (ref 26.6–33.0)
MCHC: 34.4 g/dL (ref 31.5–35.7)
MCV: 91 fL (ref 79–97)
Platelets: 336 10*3/uL (ref 150–450)
RBC: 4.23 x10E6/uL (ref 3.77–5.28)
RDW: 12.1 % (ref 11.7–15.4)
WBC: 7.8 10*3/uL (ref 3.4–10.8)

## 2020-06-22 LAB — HEMOGLOBIN A1C
Est. average glucose Bld gHb Est-mCnc: 232 mg/dL
Hgb A1c MFr Bld: 9.7 % — ABNORMAL HIGH (ref 4.8–5.6)

## 2020-06-22 LAB — LIPID PANEL
Chol/HDL Ratio: 3.7 ratio (ref 0.0–4.4)
Cholesterol, Total: 205 mg/dL — ABNORMAL HIGH (ref 100–199)
HDL: 55 mg/dL (ref >39)
LDL Chol Calc (NIH): 116 mg/dL — ABNORMAL HIGH (ref 0–99)
Triglycerides: 194 mg/dL — ABNORMAL HIGH (ref 0–149)
VLDL Cholesterol Cal: 34 mg/dL (ref 5–40)

## 2020-06-22 LAB — TSH: TSH: 1.58 u[IU]/mL (ref 0.450–4.500)

## 2020-06-24 LAB — HEMOGLOBIN A1C
Est. average glucose Bld gHb Est-mCnc: 223 mg/dL
Hgb A1c MFr Bld: 9.4 % — ABNORMAL HIGH (ref 4.8–5.6)

## 2020-12-12 ENCOUNTER — Ambulatory Visit (INDEPENDENT_AMBULATORY_CARE_PROVIDER_SITE_OTHER): Payer: Commercial Managed Care - PPO | Admitting: Advanced Practice Midwife

## 2020-12-12 ENCOUNTER — Other Ambulatory Visit: Payer: Self-pay

## 2020-12-12 ENCOUNTER — Encounter: Payer: Self-pay | Admitting: Advanced Practice Midwife

## 2020-12-12 VITALS — BP 114/78 | HR 79 | Ht 65.75 in | Wt 254.0 lb

## 2020-12-12 DIAGNOSIS — N921 Excessive and frequent menstruation with irregular cycle: Secondary | ICD-10-CM | POA: Diagnosis not present

## 2020-12-12 DIAGNOSIS — Z01419 Encounter for gynecological examination (general) (routine) without abnormal findings: Secondary | ICD-10-CM

## 2020-12-12 NOTE — Progress Notes (Signed)
Catherine Bishop 40 y.o.  Vitals:   12/12/20 0943  BP: 114/78  Pulse: 79     Filed Weights   12/12/20 0943  Weight: 254 lb (115.2 kg)    Past Medical History: Past Medical History:  Diagnosis Date  . Anxiety   . Epilepsy (HCC)    childhood last seizure 40yo, no meds  . GDM (gestational diabetes mellitus)   . Gestational diabetes    diet controlled  . Hypertension    pregnancy only  . Irregular bleeding   . Left ovarian cyst   . Pelvic pain in female   . Tilted uterus     Past Surgical History: Past Surgical History:  Procedure Laterality Date  . CHOLECYSTECTOMY N/A 06/21/2013   Procedure: LAPAROSCOPIC CHOLECYSTECTOMY;  Surgeon: Marlane Hatcher, MD;  Location: [facility];  Service: General;  Laterality: N/A;  . ERCP N/A 06/16/2013   Procedure: ENDOSCOPIC RETROGRADE CHOLANGIOPANCREATOGRAPHY (ERCP);  Surgeon: Malissa Hippo, MD;  Location: [facility];  Service: Endoscopy;  Laterality: N/A;  . LASIK    . SPHINCTEROTOMY N/A 06/16/2013   Procedure: SPHINCTEROTOMY, STONE EXTRACTION;  Surgeon: Malissa Hippo, MD;  Location: [facility];  Service: Endoscopy;  Laterality: N/A;  . WISDOM TOOTH EXTRACTION      Family History: Family History  Problem Relation Age of Onset  . Cancer Maternal Grandmother        renal  . Diabetes Maternal Grandmother   . Heart disease Maternal Grandfather   . Heart attack Maternal Grandfather   . Thyroid disease Mother   . Cancer Maternal Aunt        pancreatic  . Hypertension Neg Hx   . Hyperlipidemia Neg Hx   . Stroke Neg Hx     Social History: Social History   Tobacco Use  . Smoking status: Former Smoker    Packs/day: 0.50    Types: Cigarettes    Quit date: 06/28/2008    Years since quitting: 12.4  . Smokeless tobacco: Never Used  Vaping Use  . Vaping Use: Never used  Substance Use Topics  . Alcohol use: Not Currently  . Drug use: No    Allergies:  Allergies  Allergen Reactions  . Latex Rash  . Penicillins Rash       Current Outpatient Medications:  .  Norgestimate-Ethinyl Estradiol Triphasic (ORTHO TRI-CYCLEN, 28,) 0.18/0.215/0.25 MG-35 MCG tablet, Take 1 tablet by mouth daily., Disp: 28 tablet, Rfl: 11 .  Aspirin-Acetaminophen-Caffeine (EXCEDRIN MIGRAINE PO), Take 2 tablets by mouth. (Patient not taking: No sig reported), Disp: , Rfl:   History of Present Illness: Here for pap and physical.  Last pap 09/26/19, normal with neg HPV.  Using COCs for BC. Still bleeds heavy and long. Cramps are terrible, despite the [facility] pills. Has tried several different kinds .Interested in ablation.    Review of Systems   Patient denies any headaches, blurred vision, shortness of breath, chest pain, abdominal pain, problems with bowel movements, urination, or intercourse.   Physical Exam: General:  Well developed, well nourished, no acute distress Skin:  Warm and dry Neck:  Midline trachea, normal thyroid Lungs; Clear to auscultation bilaterally Breast:  No dominant palpable mass, retraction, or nipple discharge Cardiovascular: Regular rate and rhythm Abdomen:  Soft, non tender, no hepatosplenomegaly Pelvic:  External genitalia is normal in appearance.  The vagina is normal in appearance.  The cervix is bulbous.  Uterus is felt to be normal size, shape, and contour.  No adnexal masses or tenderness noted.  Extremities:  No swelling or varicosities noted Psych:  No mood changes.     Impression: Normal GYN  Metromenorrhagia, failed COCs     Plan: Pelvic US/preop Endo ablation Handout given

## 2020-12-17 ENCOUNTER — Ambulatory Visit (INDEPENDENT_AMBULATORY_CARE_PROVIDER_SITE_OTHER): Payer: Commercial Managed Care - PPO | Admitting: Family Medicine

## 2020-12-17 ENCOUNTER — Other Ambulatory Visit: Payer: Self-pay

## 2020-12-17 ENCOUNTER — Encounter: Payer: Self-pay | Admitting: Family Medicine

## 2020-12-17 VITALS — BP 128/86 | HR 80 | Temp 99.0°F | Wt 259.6 lb

## 2020-12-17 DIAGNOSIS — K645 Perianal venous thrombosis: Secondary | ICD-10-CM

## 2020-12-17 MED ORDER — HYDROCODONE-ACETAMINOPHEN 5-325 MG PO TABS: 1.0000 | ORAL_TABLET | Freq: Four times a day (QID) | ORAL | 0 refills | Status: AC | PRN

## 2020-12-17 MED ORDER — LIDOCAINE HCL 2 % IJ SOLN
2.0000 mL | Freq: Once | INTRAMUSCULAR | Status: AC
  Administered 2020-12-17: 40 mg via INTRADERMAL

## 2020-12-17 NOTE — Addendum Note (Signed)
Addended by: Renelda Loma on: 12/17/2020 04:08 PM   Modules accepted: Orders

## 2020-12-17 NOTE — Patient Instructions (Signed)
Hemorrhoids Hemorrhoids are swollen veins that may develop:  In the butt (rectum). These are called internal hemorrhoids.  Around the opening of the butt (anus). These are called external hemorrhoids. Hemorrhoids can cause pain, itching, or bleeding. Most of the time, they do not cause serious problems. They usually get better with diet changes, lifestyle changes, and other home treatments. What are the causes? This condition may be caused by:  Having trouble pooping (constipation).  Pushing hard (straining) to poop.  Watery poop (diarrhea).  Pregnancy.  Being very overweight (obese).  Sitting for long periods of time.  Heavy lifting or other activity that causes you to strain.  Anal sex.  Riding a bike for a long period of time. What are the signs or symptoms? Symptoms of this condition include:  Pain.  Itching or soreness in the butt.  Bleeding from the butt.  Leaking poop.  Swelling in the area.  One or more lumps around the opening of your butt. How is this diagnosed? A doctor can often diagnose this condition by looking at the affected area. The doctor may also:  Do an exam that involves feeling the area with a gloved hand (digital rectal exam).  Examine the area inside your butt using a small tube (anoscope).  Order blood tests. This may be done if you have lost a lot of blood.  Have you get a test that involves looking inside the colon using a flexible tube with a camera on the end (sigmoidoscopy or colonoscopy). How is this treated? This condition can usually be treated at home. Your doctor may tell you to change what you eat, make lifestyle changes, or try home treatments. If these do not help, procedures can be done to remove the hemorrhoids or make them smaller. These may involve:  Placing rubber bands at the base of the hemorrhoids to cut off their blood supply.  Injecting medicine into the hemorrhoids to shrink them.  Shining a type of light  energy onto the hemorrhoids to cause them to fall off.  Doing surgery to remove the hemorrhoids or cut off their blood supply. Follow these instructions at home: Eating and drinking  Eat foods that have a lot of fiber in them. These include whole grains, beans, nuts, fruits, and vegetables.  Ask your doctor about taking products that have added fiber (fibersupplements).  Reduce the amount of fat in your diet. You can do this by: ? Eating low-fat dairy products. ? Eating less red meat. ? Avoiding processed foods.  Drink enough fluid to keep your pee (urine) pale yellow.   Managing pain and swelling  Take a warm-water bath (sitz bath) for 20 minutes to ease pain. Do this 3-4 times a day. You may do this in a bathtub or using a portable sitz bath that fits over the toilet.  If told, put ice on the painful area. It may be helpful to use ice between your warm baths. ? Put ice in a plastic bag. ? Place a towel between your skin and the bag. ? Leave the ice on for 20 minutes, 2-3 times a day.   General instructions  Take over-the-counter and prescription medicines only as told by your doctor. ? Medicated creams and medicines may be used as told.  Exercise often. Ask your doctor how much and what kind of exercise is best for you.  Go to the bathroom when you have the urge to poop. Do not wait.  Avoid pushing too hard when you poop.  Keep your butt dry and clean. Use wet toilet paper or moist towelettes after pooping.  Do not sit on the toilet for a long time.  Keep all follow-up visits as told by your doctor. This is important. Contact a doctor if you:  Have pain and swelling that do not get better with treatment or medicine.  Have trouble pooping.  Cannot poop.  Have pain or swelling outside the area of the hemorrhoids. Get help right away if you have:  Bleeding that will not stop. Summary  Hemorrhoids are swollen veins in the butt or around the opening of the  butt.  They can cause pain, itching, or bleeding.  Eat foods that have a lot of fiber in them. These include whole grains, beans, nuts, fruits, and vegetables.  Take a warm-water bath (sitz bath) for 20 minutes to ease pain. Do this 3-4 times a day. This information is not intended to replace advice given to you by your health care provider. Make sure you discuss any questions you have with your health care provider. Document Revised: 08/25/2018 Document Reviewed: 01/06/2018 Elsevier Patient Education  2021 [employer]. Take the pain medication as needed.  Make sure you have softer BMs.  Irrigate the area several times per day.  If you have any questions please do not hesitate to call.

## 2020-12-17 NOTE — Progress Notes (Signed)
   Subjective:    Patient ID: Catherine Bishop, female    DOB: [birth year], 40 y.o.   MRN: [id number]  HPI She is here for evaluation of possible hemorrhoid.  She states that it started on Friday and has been slowly getting worse.  She has a previous history of thrombosed hemorrhoid with her last pregnancy requiring surgery.   Review of Systems     Objective:   Physical Exam Rectal exam does show a purplish tender lesion at the 4 o'clock position.       Assessment & Plan:  Thrombosed hemorrhoids - Plan: HYDROcodone-acetaminophen (NORCO) 5-325 MG tablet  the hemorrhoid was injected with Xylocaine.  Hemostat was used to identify the thrombosed tissue.  Multiple small clots were removed.  The hemorrhoidal tissue was examined and no further clots were noted.Take the pain medication as needed.  Make sure you have softer BMs.  Irrigate the area several times per day.  If you have any questions please do not hesitate to call.

## 2020-12-19 ENCOUNTER — Telehealth: Payer: Self-pay

## 2020-12-19 NOTE — Telephone Encounter (Signed)
Pt was lvm .KH

## 2020-12-19 NOTE — Telephone Encounter (Signed)
Told to use the pain medication as needed and make sure she has softer BMs.  Wash the area gently after a BM and continue to use sitz bath by the end of the weekend she should be doing much better

## 2020-12-19 NOTE — Telephone Encounter (Signed)
Pt wanted to know how long will she be feel the pain hemorrhoids removal. Pt has had pain with bleeding while having BM . Warm baths and not helping after a while of being out . When she is moving around pain.  Pt also advised that the pharmacy would fill her 30 supply of hydrocodone. They told her it was to many . Please advise [facility]

## 2021-01-08 ENCOUNTER — Other Ambulatory Visit: Payer: Self-pay | Admitting: Advanced Practice Midwife

## 2021-01-08 DIAGNOSIS — N921 Excessive and frequent menstruation with irregular cycle: Secondary | ICD-10-CM

## 2021-01-09 ENCOUNTER — Ambulatory Visit (INDEPENDENT_AMBULATORY_CARE_PROVIDER_SITE_OTHER): Payer: Commercial Managed Care - PPO

## 2021-01-09 ENCOUNTER — Other Ambulatory Visit: Payer: Self-pay

## 2021-01-09 ENCOUNTER — Encounter: Payer: Self-pay | Admitting: Obstetrics & Gynecology

## 2021-01-09 ENCOUNTER — Ambulatory Visit (INDEPENDENT_AMBULATORY_CARE_PROVIDER_SITE_OTHER): Payer: Commercial Managed Care - PPO | Admitting: Obstetrics & Gynecology

## 2021-01-09 VITALS — BP 132/87 | HR 70 | Ht 65.75 in | Wt 257.0 lb

## 2021-01-09 DIAGNOSIS — N946 Dysmenorrhea, unspecified: Secondary | ICD-10-CM | POA: Diagnosis not present

## 2021-01-09 DIAGNOSIS — N921 Excessive and frequent menstruation with irregular cycle: Secondary | ICD-10-CM

## 2021-01-09 MED ORDER — LEVONORGESTREL 20 MCG/DAY IU IUD
1.0000 | INTRAUTERINE_SYSTEM | Freq: Once | INTRAUTERINE | Status: AC
  Administered 2021-01-09: 1 via INTRAUTERINE

## 2021-01-09 MED ORDER — MEGESTROL ACETATE 40 MG PO TABS: ORAL_TABLET | ORAL | 0 refills | Status: AC

## 2021-01-09 NOTE — Addendum Note (Signed)
Addended by: Colen Darling on: 01/09/2021 12:44 PM   Modules accepted: Orders

## 2021-01-09 NOTE — Progress Notes (Addendum)
PELVIC US TA/TV: heterogeneous axial positional uterus,mult small simple nabothian cysts,EEC 7.4 mm,normal ovaries,right ovary not seen on T/V images,no free fluid,no pain during ultrasound  Chaperone Peggy

## 2021-01-09 NOTE — Progress Notes (Signed)
IUD Insertion Procedure Note  Pre-operative Diagnosis: menometrorrhagia                                              Dysmenorrhea                                              Normal sonogram  Post-operative Diagnosis: same  Indications: contraception  Procedure Details  Urine pregnancy test was not done, pt is on COC.  The risks (including infection, bleeding, pain, and uterine perforation) and benefits of the procedure were explained to the patient and Written informed consent was obtained.    Cervix cleansed with Betadine. Uterus sounded to 9 cm. IUD inserted without difficulty. String visible and trimmed. Patient tolerated procedure well.  IUD Information: Mirena. Lot [id number] Expires June 2024  Condition: Stable  Complications: None  Plan: Megestrol taper this month to synchronize endometrium for the month then turn it over to the IUD alone  The patient was advised to call for any fever or for prolonged or severe pain or bleeding. She was advised to use OTC ibuprofen as needed for mild to moderate pain.   I placed it myself Attending Physician Documentation: Patient ID: Catherine Bishop, female   DOB: 1981-08-16, 40 y.o.   MRN: [id number]

## 2021-06-23 ENCOUNTER — Encounter: Payer: Commercial Managed Care - PPO | Admitting: Medical

## 2021-06-23 ENCOUNTER — Telehealth: Payer: Self-pay | Admitting: Medical

## 2021-06-23 NOTE — Telephone Encounter (Signed)
Will do!

## 2021-06-23 NOTE — Telephone Encounter (Signed)

## 2022-05-06 ENCOUNTER — Encounter: Payer: Self-pay | Admitting: Internal Medicine

## 2022-06-09 ENCOUNTER — Encounter: Payer: Self-pay | Admitting: Internal Medicine

## 2023-04-28 ENCOUNTER — Ambulatory Visit: Payer: Self-pay | Admitting: Obstetrics and Gynecology
